# Patient Record
Sex: Male | Born: 1960 | Race: White | Hispanic: No | Marital: Married | Smoking: Former smoker
Health system: Southern US, Community
[De-identification: ages and names within clinical notes are randomized; demographics above are authoritative.]

## PROBLEM LIST (undated history)

## (undated) DIAGNOSIS — Z8249 Family history of ischemic heart disease and other diseases of the circulatory system: Secondary | ICD-10-CM

## (undated) DIAGNOSIS — E785 Hyperlipidemia, unspecified: Secondary | ICD-10-CM

## (undated) HISTORY — PX: DENTAL SURGERY: SHX609

## (undated) HISTORY — PX: COLONOSCOPY W/ POLYPECTOMY: SHX1380

---

## 2018-05-14 ENCOUNTER — Encounter: Payer: Self-pay | Admitting: Emergency Medicine

## 2018-05-14 ENCOUNTER — Emergency Department
Admission: EM | Admit: 2018-05-14 | Discharge: 2018-05-14 | Disposition: A | Discharge status: Home / Self Care | Payer: BC Managed Care – PPO | Attending: Emergency Medicine | Admitting: Emergency Medicine

## 2018-05-14 ENCOUNTER — Other Ambulatory Visit: Payer: Self-pay

## 2018-05-14 ENCOUNTER — Emergency Department: Payer: BC Managed Care – PPO

## 2018-05-14 DIAGNOSIS — D681 Hereditary factor XI deficiency: Secondary | ICD-10-CM

## 2018-05-14 DIAGNOSIS — J36 Peritonsillar abscess: Secondary | ICD-10-CM | POA: Diagnosis not present

## 2018-05-14 DIAGNOSIS — R070 Pain in throat: Secondary | ICD-10-CM | POA: Diagnosis present

## 2018-05-14 DIAGNOSIS — J029 Acute pharyngitis, unspecified: Secondary | ICD-10-CM

## 2018-05-14 HISTORY — DX: Hyperlipidemia, unspecified: E78.5

## 2018-05-14 LAB — CBC WITH DIFFERENTIAL/PLATELET
Abs Immature Granulocytes: 0.05 10*3/uL (ref 0.00–0.07)
BASOS ABS: 0 10*3/uL (ref 0.0–0.1)
Basophils Relative: 0 %
EOS PCT: 0 %
Eosinophils Absolute: 0 10*3/uL (ref 0.0–0.5)
HCT: 39.7 % (ref 39.0–52.0)
Hemoglobin: 14.1 g/dL (ref 13.0–17.0)
Immature Granulocytes: 0 %
Lymphocytes Relative: 18 %
Lymphs Abs: 2.1 10*3/uL (ref 0.7–4.0)
MCH: 31.7 pg (ref 26.0–34.0)
MCHC: 35.5 g/dL (ref 30.0–36.0)
MCV: 89.2 fL (ref 80.0–100.0)
Monocytes Absolute: 1 10*3/uL (ref 0.1–1.0)
Monocytes Relative: 8 %
Neutro Abs: 8.5 10*3/uL — ABNORMAL HIGH (ref 1.7–7.7)
Neutrophils Relative %: 74 %
PLATELETS: 183 10*3/uL (ref 150–400)
RBC: 4.45 MIL/uL (ref 4.22–5.81)
RDW: 11.5 % (ref 11.5–15.5)
WBC: 11.7 10*3/uL — ABNORMAL HIGH (ref 4.0–10.5)
nRBC: 0 % (ref 0.0–0.2)

## 2018-05-14 LAB — BASIC METABOLIC PANEL
Anion gap: 9 (ref 5–15)
BUN: 14 mg/dL (ref 6–20)
CO2: 23 mmol/L (ref 22–32)
Calcium: 9.1 mg/dL (ref 8.9–10.3)
Chloride: 104 mmol/L (ref 98–111)
Creatinine, Ser: 0.96 mg/dL (ref 0.61–1.24)
GFR calc Af Amer: >60 mL/min (ref >60)
GFR calc non Af Amer: >60 mL/min (ref >60)
Glucose, Bld: 97 mg/dL (ref 70–99)
Potassium: 3.9 mmol/L (ref 3.5–5.1)
Sodium: 136 mmol/L (ref 135–145)

## 2018-05-14 LAB — GROUP A STREP BY PCR: Group A Strep by PCR: NOT DETECTED

## 2018-05-14 MED ORDER — FENTANYL CITRATE (PF) 100 MCG/2ML IJ SOLN
50.0000 ug | Freq: Once | INTRAMUSCULAR | Status: AC
  Administered 2018-05-14: 50 ug via INTRAVENOUS
  Filled 2018-05-14: qty 2

## 2018-05-14 MED ORDER — CLINDAMYCIN PHOSPHATE 600 MG/50ML IV SOLN
600.0000 mg | Freq: Once | INTRAVENOUS | Status: AC
  Administered 2018-05-14: 600 mg via INTRAVENOUS
  Filled 2018-05-14: qty 50

## 2018-05-14 MED ORDER — OXYCODONE-ACETAMINOPHEN 5-325 MG/5ML PO SOLN: 5.0000 mL | ORAL | 0 refills | Status: AC | PRN

## 2018-05-14 MED ORDER — DEXAMETHASONE SODIUM PHOSPHATE 10 MG/ML IJ SOLN
10.0000 mg | Freq: Once | INTRAMUSCULAR | Status: AC
  Administered 2018-05-14: 10 mg via INTRAVENOUS
  Filled 2018-05-14: qty 1

## 2018-05-14 MED ORDER — LIDOCAINE-EPINEPHRINE 2 %-1:100000 IJ SOLN
20.0000 mL | Freq: Once | INTRAMUSCULAR | Status: AC
  Administered 2018-05-14: 20 mL
  Filled 2018-05-14: qty 1

## 2018-05-14 MED ORDER — IOHEXOL 300 MG/ML  SOLN
75.0000 mL | Freq: Once | INTRAMUSCULAR | Status: AC | PRN
  Administered 2018-05-14: 75 mL via INTRAVENOUS

## 2018-05-14 MED ORDER — PREDNISONE 20 MG PO TABS: 60.0000 mg | ORAL_TABLET | Freq: Every day | ORAL | 0 refills | Status: DC

## 2018-05-14 MED ORDER — CLINDAMYCIN HCL 300 MG PO CAPS: 300.0000 mg | ORAL_CAPSULE | Freq: Three times a day (TID) | ORAL | 0 refills | Status: AC

## 2018-05-14 NOTE — ED Notes (Signed)
Patient verbalized understanding of discharge instructions, no questions. Patient ambulated out of ED with steady gait in no distress.  

## 2018-05-14 NOTE — Discharge Instructions (Addendum)
You had a small abscess in your tonsils, it was treated by ENT.  If you have increased pain or swelling trouble breathing high fevers inability to swallow or other new or worrisome symptoms return to the emergency department.  Otherwise, please follow closely with primary care doctor and ENT.  Take all the medications as directed until gone unless you have a side effect.

## 2018-05-14 NOTE — ED Provider Notes (Addendum)
[facility] Emergency Department Provider Note  ____________________________________________   I have reviewed the triage vital signs and the nursing notes. Where available I have reviewed prior notes and, if possible and indicated, outside hospital notes.    HISTORY  Chief Complaint Sore Throat    HPI Norman Ritter is a 58 y.o. male who is healthy, states that over the last 3 to 4 days he has had some right ear pain, which developed into a sore throat.  His throat soreness is getting worse.  He does feel some swelling in the upper part of his soft palate.  He does not have any trouble breathing but it hurts to swallow and he is not able to eat much because of the pain.  He denies any fever or chills or vomiting, nothing makes it better nothing makes it worse no other alleviating or aggravating symptoms, did take Tylenol at some point.  Would like pain medication.  Denies any recent antibiotics.  No other medical history.    Past Medical History:  Diagnosis Date  . Hyperlipemia     There are no active problems to display for this patient.     Prior to Admission medications   Not on File    Allergies Patient has no known allergies.  No family history on file.  Social History Social History   Tobacco Use  . Smoking status: Never Smoker  . Smokeless tobacco: Never Used  Substance Use Topics  . Alcohol use: Yes  . Drug use: Never    Review of Systems Constitutional: No fever/chills Eyes: No visual changes. ENT: +sore throat. No stiff neck no neck pain Cardiovascular: Denies chest pain. Respiratory: Denies shortness of breath. Gastrointestinal:   no vomiting.  No diarrhea.  No constipation. Genitourinary: Negative for dysuria. Musculoskeletal: Negative lower extremity swelling Skin: Negative for rash. Neurological: Negative for severe headaches, focal weakness or numbness.   ____________________________________________   PHYSICAL  EXAM:  VITAL SIGNS: ED Triage Vitals  Enc Vitals Group     BP 05/14/18 1806 (!) 134/91     Pulse Rate 05/14/18 1806 93     Resp --      Temp 05/14/18 1806 99.1 F (37.3 C)     Temp Source 05/14/18 1806 Oral     SpO2 05/14/18 1806 96 %     Weight 05/14/18 1807 220 lb (99.8 kg)     Height 05/14/18 1807 6' (1.829 m)     Head Circumference --      Peak Flow --      Pain Score 05/14/18 1807 7     Pain Loc --      Pain Edu? --      Excl. in GC? --     Constitutional: Alert and oriented. Well appearing and in no acute distress. Eyes: Conjunctivae are normal Head: Atraumatic HEENT: No congestion/rhinnorhea. Mucous membranes are moist.  He can open his mouth fully although he states it hurts there is no clear trismus, he has a posterior pharynx which appears grossly normal slightly erythematous but is soft palate is quite swollen and edematous on the right side.  There is no drainage.  There is no obvious fluctuance.  There is no stridor, he speaks in clear voice with no hoarseness.  There is no evidence of respiratory distress Neck:   Nontender with no meningismus, no masses, no stridor Cardiovascular: Normal rate, regular rhythm. Grossly normal heart sounds.  Good peripheral circulation. Respiratory: Normal respiratory effort.  No retractions.  Lungs CTAB. Abdominal: Soft and nontender. No distention. No guarding no rebound Back:  There is no focal tenderness or step off.  there is no midline tenderness there are no lesions noted. there is no CVA tenderness Musculoskeletal: No lower extremity tenderness, no upper extremity tenderness. No joint effusions, no DVT signs strong distal pulses no edema Neurologic:  Normal speech and language. No gross focal neurologic deficits are appreciated.  Skin:  Skin is warm, dry and intact. No rash noted. Psychiatric: Mood and affect are normal. Speech and behavior are normal.  ____________________________________________   LABS (all labs ordered are  listed, but only abnormal results are displayed)  Labs Reviewed  CBC WITH DIFFERENTIAL/PLATELET - Abnormal; Notable for the following components:      Result Value   WBC 11.7 (*)    Neutro Abs 8.5 (*)    All other components within normal limits  CULTURE, BETA STREP (GROUP B ONLY)  BASIC METABOLIC PANEL    Pertinent labs  results that were available during my care of the patient were reviewed by me and considered in my medical decision making (see chart for details). ____________________________________________  EKG  I personally interpreted any EKGs ordered by me or triage  ____________________________________________  RADIOLOGY  Pertinent labs & imaging results that were available during my care of the patient were reviewed by me and considered in my medical decision making (see chart for details). If possible, patient and/or family made aware of any abnormal findings.  No results found. ____________________________________________    PROCEDURES  Procedure(s) performed: None  Procedures  Critical Care performed: None  ____________________________________________   INITIAL IMPRESSION / ASSESSMENT AND PLAN / ED COURSE  Pertinent labs & imaging results that were available during my care of the patient were reviewed by me and considered in my medical decision making (see chart for details).  Patient here with soft tissue swelling most likely infectious in origin, consistent with a phlegmon versus early abscess, no evidence of impending airway issue.  Very difficult from just oral exam to get a full assessment of the degree of loculation, we will therefore obtain imaging.  In the meantime I am giving him steroids, to help with the inflammation, we will monitor him closely the emergency room to forestall any possible airway decompensation and we will administer pain medications and reassess.  ----------------------------------------- 7:54 PM on  05/14/2018 -----------------------------------------  Patient with a tiny abscess, discussed with ENT, Dr. Willeen CassBennett, he is graciously looking at the CT scan and will call me back  ----------------------------------------- 8:40 PM on 05/14/2018 -----------------------------------------  Seen and evaluated by Dr. Willeen CassBennett, please see his note.  He did have an I&D of that abscess which went very well at bedside, and he feels better already.  ENT is requesting that we discharge the patient with clindamycin and steroids which we will effectuate.  Return precautions follow-up given and understood.  Patient and I discussed admission but he is very adamant that he would prefer to go home.    ____________________________________________   FINAL CLINICAL IMPRESSION(S) / ED DIAGNOSES  Final diagnoses:  None      This chart was dictated using voice recognition software.  Despite best efforts to proofread,  errors can occur which can change meaning.      Jeanmarie PlantMcShane, Febe Champa A, MD 05/14/18 1837    Jeanmarie PlantMcShane, Serra Younan A, MD 05/14/18 1954    Jeanmarie PlantMcShane, Julieann Drummonds A, MD 05/14/18 2040

## 2018-05-14 NOTE — ED Triage Notes (Signed)
Pt with right ear pain that started Sunday, went to md on Monday and had wax removed, returned to [facility] today for increased pain and was told he has a peritonsillar abscess and to come to ed. Pt states that he can feel his throat swelling. No acute respiratory distress.

## 2018-05-14 NOTE — Consult Note (Signed)
Despina AriasCarolan, Tymere [id number] 1961-02-16 Sandi MealyPaul S Allysha Tryon, MD  Reason for Consult: Right peritonsillar abscess Requesting Physician: Jeanmarie PlantMcShane, James A, MD Consulting Physician: Sandi MealyPaul S Armanii Urbanik, MD  HPI: This 58 y.o. year old male presented to the emergency room on 05/14/2018 for worsening sore throat.  He had some ear pain earlier in the week and had his ear cleared of cerumen at an urgent care.  Over the past 24 hours he is developed further ear pain, some feeling of congestion of the back of his nose, and worsening sore throat.  He is also noted it is more difficult to swallow, but has not had any significant fever.  CT in the emergency room confirmed a small 1.3 cm right peritonsillar abscess.  He has received clindamycin and Decadron in the emergency room and seems a little better even after those medications.  There is no history of recurrent tonsillitis.  Allergies: No Known Allergies  Medications: (Not in a hospital admission) .  No current facility-administered medications for this encounter.    No current outpatient medications on file.    PMH:  Past Medical History:  Diagnosis Date  . Hyperlipemia     Fam Hx: No family history on file.  Soc Hx:  Social History   Socioeconomic History  . Marital status: Married    Spouse name: Not on file  . Number of children: Not on file  . Years of education: Not on file  . Highest education level: Not on file  Occupational History  . Not on file  Social Needs  . Financial resource strain: Not on file  . Food insecurity:    Worry: Not on file    Inability: Not on file  . Transportation needs:    Medical: Not on file    Non-medical: Not on file  Tobacco Use  . Smoking status: Never Smoker  . Smokeless tobacco: Never Used  Substance and Sexual Activity  . Alcohol use: Yes  . Drug use: Never  . Sexual activity: Not on file  Lifestyle  . Physical activity:    Days per week: Not on file    Minutes per session: Not on file  .  Stress: Not on file  Relationships  . Social connections:    Talks on phone: Not on file    Gets together: Not on file    Attends religious service: Not on file    Active member of club or organization: Not on file    Attends meetings of clubs or organizations: Not on file    Relationship status: Not on file  . Intimate partner violence:    Fear of current or ex partner: Not on file    Emotionally abused: Not on file    Physically abused: Not on file    Forced sexual activity: Not on file  Other Topics Concern  . Not on file  Social History Narrative  . Not on file     PSH: Noncontributory   ROS: Review of systems normal other than 12 systems except per HPI.  PHYSICAL EXAM Vitals:  Vitals:   05/14/18 1806 05/14/18 1930  BP: (!) 134/91 (!) 148/85  Pulse: 93 82  Resp:  16  Temp: 99.1 F (37.3 C)   SpO2: 96% 98%  .  General: Well-developed, Well-nourished in no acute distress Mood: Mood and affect well adjusted, pleasant and cooperative. Orientation: Grossly alert and oriented. Vocal Quality: No hoarseness. Communicates verbally. head and Face: NCAT. No facial asymmetry. No visible skin lesions.  No significant facial scars. No tenderness with sinus percussion. Facial strength normal and symmetric. Ears: External ears with normal landmarks, no lesions. External auditory canals free of infection, cerumen impaction or lesions. Tympanic membranes intact with good landmarks and normal mobility on pneumatic otoscopy. No middle ear effusion. Hearing: Speech reception grossly normal. Nose: External nose normal with midline dorsum and no lesions or deformity. Nasal Cavity reveals essentially midline septum with normal inferior turbinates. No significant mucosal congestion or erythema. Nasal secretions are minimal and clear. No polyps seen on anterior rhinoscopy. Oral Cavity/ Oropharynx: Lips are normal with no lesions. Teeth no frank dental caries. Gingiva healthy with no lesions or  gingivitis.  The right tonsil is edematous without significant erythema and is pushed medially with some shift of the uvula to the left.  The uvula is a little swollen as well.  There is mild to moderate swelling of the adjacent soft palate on the right. Indirect Laryngoscopy/Nasopharyngoscopy: Visualization of the larynx, hypopharynx and nasopharynx is not possible in this setting with routine examination. Neck: Supple and symmetric with no palpable masses, tenderness or crepitance. The trachea is midline. Thyroid gland is soft, nontender and symmetric with no masses or enlargement. Parotid and submandibular glands are soft, nontender and symmetric, without masses. Lymphatic: Shotty right cervical lymphadenopathy. Respiratory: Normal respiratory effort without labored breathing. Cardiovascular: Carotid pulse shows regular rate and rhythm Neurologic: Cranial Nerves II through XII are grossly intact. Eyes: Gaze and Ocular Motility are grossly normal. PERRLA. No visible nystagmus.  MEDICAL DECISION MAKING: Data Review:  Results for orders placed or performed during the hospital encounter of 05/14/18 (from the past 48 hour(s))  CBC with Differential     Status: Abnormal   Collection Time: 05/14/18  6:16 PM  Result Value Ref Range   WBC 11.7 (H) 4.0 - 10.5 K/uL   RBC 4.45 4.22 - 5.81 MIL/uL   Hemoglobin 14.1 13.0 - 17.0 g/dL   HCT 66.0 63.0 - 16.0 %   MCV 89.2 80.0 - 100.0 fL   MCH 31.7 26.0 - 34.0 pg   MCHC 35.5 30.0 - 36.0 g/dL   RDW 10.9 32.3 - 55.7 %   Platelets 183 150 - 400 K/uL   nRBC 0.0 0.0 - 0.2 %   Neutrophils Relative % 74 %   Neutro Abs 8.5 (H) 1.7 - 7.7 K/uL   Lymphocytes Relative 18 %   Lymphs Abs 2.1 0.7 - 4.0 K/uL   Monocytes Relative 8 %   Monocytes Absolute 1.0 0.1 - 1.0 K/uL   Eosinophils Relative 0 %   Eosinophils Absolute 0.0 0.0 - 0.5 K/uL   Basophils Relative 0 %   Basophils Absolute 0.0 0.0 - 0.1 K/uL   Immature Granulocytes 0 %   Abs Immature Granulocytes  0.05 0.00 - 0.07 K/uL    Comment: Performed at [facility], [address] [place], [place] [address]  Basic metabolic panel     Status: None   Collection Time: 05/14/18  6:16 PM  Result Value Ref Range   Sodium 136 135 - 145 mmol/L   Potassium 3.9 3.5 - 5.1 mmol/L   Chloride 104 98 - 111 mmol/L   CO2 23 22 - 32 mmol/L   Glucose, Bld 97 70 - 99 mg/dL   BUN 14 6 - 20 mg/dL   Creatinine, Ser 5.42 0.61 - 1.24 mg/dL   Calcium 9.1 8.9 - 70.6 mg/dL   GFR calc non Af Amer >60 >60 mL/min   GFR calc Af  Amer >60 >60 mL/min   Anion gap 9 5 - 15    Comment: Performed at [facility], [address] [place], [place] [address]  . Ct Soft Tissue Neck W Contrast  Result Date: 05/14/2018 CLINICAL DATA:  58 y/o M; throat swelling, rule out peritonsillar abscess. EXAM: CT NECK WITH CONTRAST TECHNIQUE: Multidetector CT imaging of the neck was performed using the standard protocol following the bolus administration of intravenous contrast. CONTRAST:  33mL OMNIPAQUE IOHEXOL 300 MG/ML  SOLN COMPARISON:  None. FINDINGS: Pharynx and larynx: Right-sided palatine tonsil abscess measuring approximately 13 mm, partially obscured by streak artifact from dental hardware (series 7, image 44). Surrounding inflammation is present within the right parapharyngeal space and right lateral wall of the pharynx. No extension of the abscess into deep cervical compartments. Salivary glands: No inflammation, mass, or stone. Thyroid: Normal. Lymph nodes: Mildly enlarged right-sided cervical lymph nodes, likely reactive. No lymph node necrosis. Vascular: Negative. Limited intracranial: Negative. Visualized orbits: Negative. Mastoids and visualized paranasal sinuses: Clear. Skeleton: No acute or aggressive process. Mild cervical spondylosis with disc and facet degenerative changes greatest at C5-C7 levels. Upper chest: Negative. Other: None. IMPRESSION: Right palatine tonsil abscess measuring approximately 13 mm,  partially obscured by streak artifact from dental hardware. Surrounding inflammation within the right parapharyngeal space and right lateral wall of the pharynx. No extension of the abscess into deep cervical compartments. Electronically Signed   By: Mitzi Hansen M.D.   On: 05/14/2018 19:12  .   PROCEDURE: Preoperative Diagnosis: Right peritonsillar abscess Postoperative Diagnosis: Same Procedure: Incision and drainage of right peritonsillar abscess Indications: Right peritonsillar abscess Findings: Small right peritonsillar abscess located just lateral to the tonsil.  Approximately 1.5 cc of cloudy purulent fluid was aspirated. Description of Procedure: After discussing procedure and risks  (primarily bleeding) with the patient, the throat anesthetized with topical Hurricane spray and the region around the right tonsil injected with 2% lidocaine with epinephrine, 1:100,000. An 18 gage needle was used to aspirate above the tonsil, aspirating approximately 1.5 cc of purulence. This area was then opened with a 15 blade and a hemostat used to carefully bluntly open the peritonsillar space and drain any loculated purulence. The patient tolerated the procedure well.   ASSESSMENT: Right peritonsillar abscess  PLAN: The abscess was successfully drained.  The patient appears to be handling his secretions well and noted immediate relief after drainage of the abscess, and was already improving after initial doses of clindamycin and Decadron.  Would recommend discharge home on clindamycin 300 mg p.o. 4 times daily for 10 days and a Sterapred double strength 6-day taper.  He can follow-up in my office next week.  If he has any worsening over the weekend he should return to the emergency room.  Sandi Mealy, MD 05/14/2018 8:37 PM

## 2019-04-29 ENCOUNTER — Other Ambulatory Visit: Payer: Self-pay | Admitting: Family Medicine

## 2019-04-29 ENCOUNTER — Other Ambulatory Visit (HOSPITAL_COMMUNITY): Payer: Self-pay | Admitting: Family Medicine

## 2019-04-29 DIAGNOSIS — N5089 Other specified disorders of the male genital organs: Secondary | ICD-10-CM

## 2019-05-06 ENCOUNTER — Ambulatory Visit
Admission: RE | Admit: 2019-05-06 | Discharge: 2019-05-06 | Disposition: A | Discharge status: Home / Self Care | Payer: BC Managed Care – PPO | Source: Ambulatory Visit | Attending: Family Medicine | Admitting: Family Medicine

## 2019-05-06 ENCOUNTER — Other Ambulatory Visit: Payer: Self-pay

## 2019-05-06 DIAGNOSIS — N5089 Other specified disorders of the male genital organs: Secondary | ICD-10-CM | POA: Diagnosis not present

## 2019-05-07 ENCOUNTER — Other Ambulatory Visit: Payer: Self-pay | Admitting: Family Medicine

## 2019-05-07 DIAGNOSIS — N5089 Other specified disorders of the male genital organs: Secondary | ICD-10-CM

## 2019-06-11 ENCOUNTER — Ambulatory Visit: Payer: BC Managed Care – PPO | Admitting: Urology

## 2019-06-11 ENCOUNTER — Encounter: Payer: Self-pay | Admitting: Urology

## 2019-06-11 ENCOUNTER — Other Ambulatory Visit: Payer: Self-pay

## 2019-06-11 VITALS — BP 138/95 | HR 82 | Ht 72.0 in | Wt 222.0 lb

## 2019-06-11 DIAGNOSIS — N5089 Other specified disorders of the male genital organs: Secondary | ICD-10-CM | POA: Diagnosis not present

## 2019-06-12 ENCOUNTER — Encounter: Payer: Self-pay | Admitting: Urology

## 2019-06-12 NOTE — Progress Notes (Signed)
06/11/2019 7:59 AM   Despina Arias 04-Sep-1960 [id number]  Referring provider: Jerl Mina, MD [address] [facility] [place],  [place] [address]  Chief Complaint  Patient presents with  . Other    HPI: 59 y.o. male seen in consultation at the request of Dr. Burnett Sheng for evaluation of a left hemiscrotal mass.  He noticed a mass in the left upper scrotal/groin region on 04/25/2019.  He saw Dr. Burnett Sheng on 12/23 and the exam describes a 3 x 5 cm hard mass in the left hemiscrotum.  A scrotal ultrasound was ordered which showed normal-appearing testes without mass, hydrocele or spermatocele. A 0.9 x 0.5 x 0.6 cm ill-defined subcutaneous lesion was identified lateral to the left hemiscrotum at the site of the palpable abnormality.  He had noted a small amount of drainage from the area and when he checked last week he states the lesion was gone.  He had no pain, redness or significant drainage.  PMH: Past Medical History:  Diagnosis Date  . Hyperlipemia     Surgical History: History reviewed. No pertinent surgical history.  Home Medications:  Allergies as of 06/11/2019   No Known Allergies     Medication List       Accurate as of June 11, 2019 11:59 PM. If you have any questions, ask your nurse or doctor.        metroNIDAZOLE 1 % gel Commonly known as: METROGEL Apply 1 application topically daily.   predniSONE 20 MG tablet Commonly known as: Deltasone Take 3 tablets (60 mg total) by mouth daily.   simvastatin 40 MG tablet Commonly known as: ZOCOR Take 40 mg by mouth at bedtime.       Allergies: No Known Allergies  Family History: History reviewed. No pertinent family history.  Social History:  reports that he has never smoked. He has never used smokeless tobacco. He reports current alcohol use. He reports that he does not use drugs.  ROS: UROLOGY Frequent Urination?: No Hard to postpone urination?: No Burning/pain with urination?: No Get up  at night to urinate?: No Leakage of urine?: No Urine stream starts and stops?: No Trouble starting stream?: No Do you have to strain to urinate?: No Blood in urine?: No Urinary tract infection?: No Sexually transmitted disease?: No Injury to kidneys or bladder?: No Painful intercourse?: No Weak stream?: No Erection problems?: No Penile pain?: No  Gastrointestinal Nausea?: No Vomiting?: No Indigestion/heartburn?: No Diarrhea?: No Constipation?: No  Constitutional Fever: No Night sweats?: No Weight loss?: No Fatigue?: No  Skin Skin rash/lesions?: Yes Itching?: No  Eyes Blurred vision?: No Double vision?: No  Ears/Nose/Throat Sore throat?: No Sinus problems?: No  Hematologic/Lymphatic Swollen glands?: Yes Easy bruising?: No  Cardiovascular Leg swelling?: No Chest pain?: No  Respiratory Cough?: No Shortness of breath?: No  Endocrine Excessive thirst?: No  Musculoskeletal Back pain?: No Joint pain?: No  Neurological Headaches?: No Dizziness?: No  Psychologic Depression?: No Anxiety?: No  Physical Exam: BP (!) 138/95   Pulse 82   Ht 6' (1.829 m)   Wt 222 lb (100.7 kg)   BMI 30.11 kg/m   Constitutional:  Alert and oriented, No acute distress. HEENT: [place] AT, moist mucus membranes.  Trachea midline, no masses. Cardiovascular: No clubbing, cyanosis, or edema. Respiratory: Normal respiratory effort, no increased work of breathing. GU: Phallus without lesions, testes descended bilaterally without masses or tenderness.  Spermatic cord/epididymis palpably normal bilaterally.  In the area where the patient indicates the mass was  previously noted there is mild skin discoloration.  There is minimal induration in the area but no discrete mass Lymph: No inguinal lymphadenopathy. Skin: No rashes, bruises or suspicious lesions. Neurologic: Grossly intact, no focal deficits, moving all 4 extremities. Psychiatric: Normal mood and affect.   Pertinent  Imaging: Scrotal ultrasound personally reviewed   Assessment & Plan:   59 y.o. male with a history of left groin mass which on ultrasound was superficial.  The mass is no longer present and most likely an inclusion cyst which spontaneously ruptured.  He will follow-up as needed for any recurrent lesions for reexam.   Abbie Sons, MD  [place] [address], [place], [place] [address] [phone]

## 2019-08-24 ENCOUNTER — Ambulatory Visit: Payer: BC Managed Care – PPO | Admitting: Dermatology

## 2019-08-24 ENCOUNTER — Other Ambulatory Visit: Payer: Self-pay

## 2019-08-24 DIAGNOSIS — L570 Actinic keratosis: Secondary | ICD-10-CM | POA: Diagnosis not present

## 2019-08-24 DIAGNOSIS — L578 Other skin changes due to chronic exposure to nonionizing radiation: Secondary | ICD-10-CM | POA: Diagnosis not present

## 2019-08-24 NOTE — Progress Notes (Signed)
   Follow-Up Visit   Subjective  Norman Ritter is a 59 y.o. male who presents for the following: Actinic Keratosis (face and hands - recheck previously treated skin lesions and sun exposed areas).  The following portions of the chart were reviewed this encounter and updated as appropriate: Tobacco  Allergies  Meds  Problems  Med Hx  Surg Hx  Fam Hx     Review of Systems: No other skin or systemic complaints.  Objective  Well appearing patient in no apparent distress; mood and affect are within normal limits.  A focused examination was performed including the face, ears, arms and hands. Relevant physical exam findings are noted in the Assessment and Plan.  Objective  Face and ears x 7 (7): Erythematous thin papules/macules with gritty scale.   Assessment & Plan    AK (actinic keratosis) (7) Face and ears x 7  Destruction of lesion - Face and ears x 7 Complexity: simple   Destruction method: cryotherapy   Informed consent: discussed and consent obtained   Timeout:  patient name, date of birth, surgical site, and procedure verified Lesion destroyed using liquid nitrogen: Yes   Region frozen until ice ball extended beyond lesion: Yes   Outcome: patient tolerated procedure well with no complications   Post-procedure details: wound care instructions given     Actinic Damage - diffuse scaly erythematous macules with underlying dyspigmentation - Recommend daily broad spectrum sunscreen SPF 30+ to sun-exposed areas, reapply every 2 hours as needed.  - Call for new or changing lesions.  Return in about 6 months (around 02/23/2020) for TBSE.   Maylene Roes, CMA, am acting as scribe for Armida Sans, MD .

## 2019-08-25 ENCOUNTER — Encounter: Payer: Self-pay | Admitting: Dermatology

## 2020-02-22 ENCOUNTER — Ambulatory Visit: Payer: BC Managed Care – PPO | Admitting: Dermatology

## 2020-04-28 IMAGING — CT CT NECK W/ CM
5 of 6 series · 14 of 35 positions shown, 16 images · IV contrast (omnipaque)
Comparison: None.

CLINICAL DATA: 57 y/o M; throat swelling, rule out peritonsillar
abscess.

EXAM:
CT NECK WITH CONTRAST
TECHNIQUE: Multidetector CT imaging of the neck was performed using the
standard protocol following the bolus administration of intravenous
contrast.
CONTRAST:  75mL OMNIPAQUE IOHEXOL 300 MG/ML  SOLN

[Series 2: axial neck · axial · 0.62mm/px · z∈[-321,-227]mm · 2 of 141 slices shown, 3 images]
[im 47/141  soft-tissue]
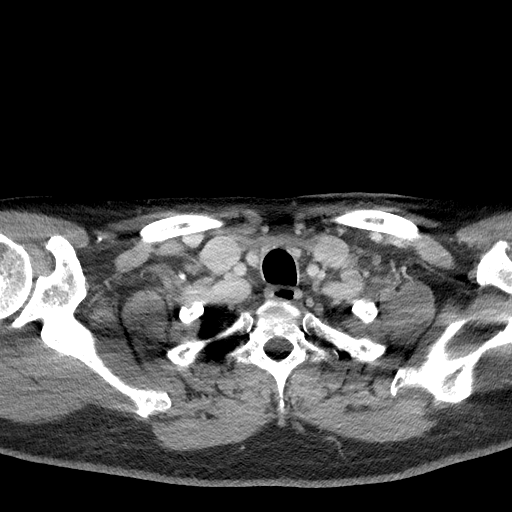
[im 47/141  bone]
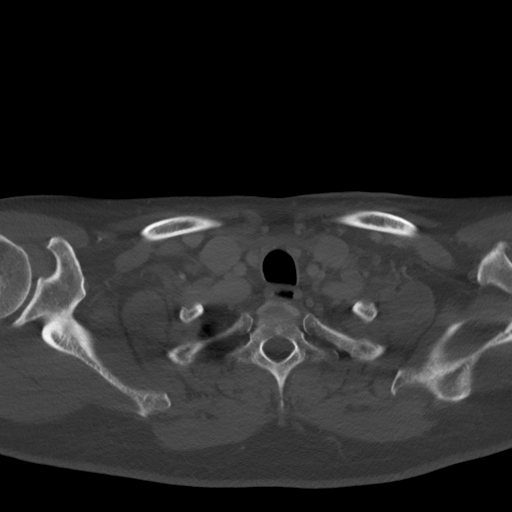
[im 94/141  bone]
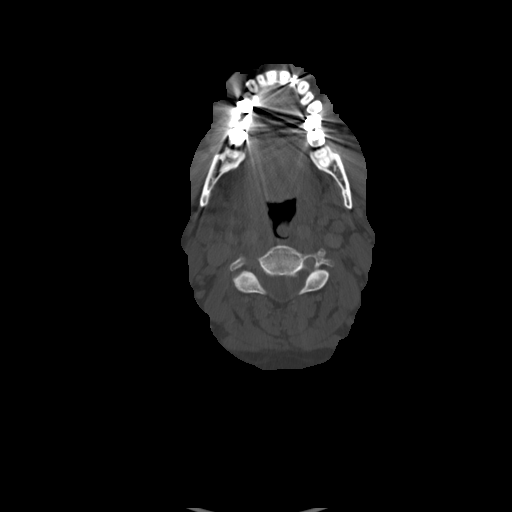

[Series 5: axial neck (person_name) (person_name) · axial · 0.62mm/px · z∈[-321,-227]mm · 2 of 141 slices shown]
[im 47/141  bone]
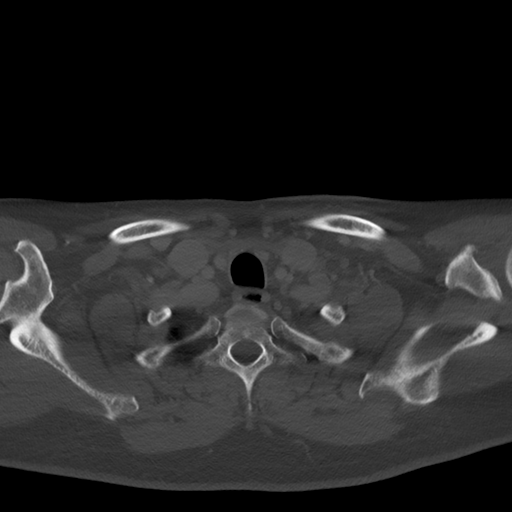
[im 94/141  bone]
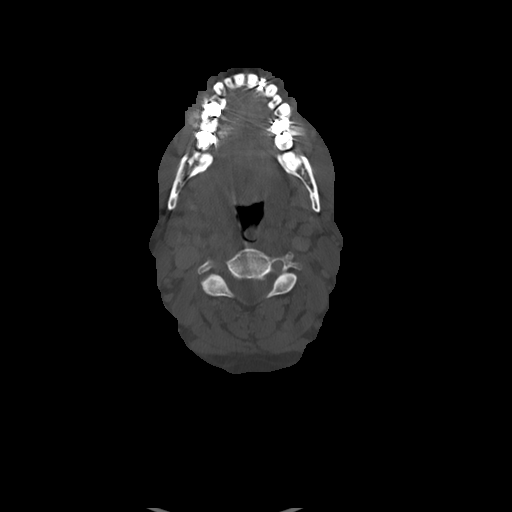

[Series 7: sag neck · sagittal · 0.61mm/px · 5 of 117 slices shown, 6 images]
[im 39/117  bone]
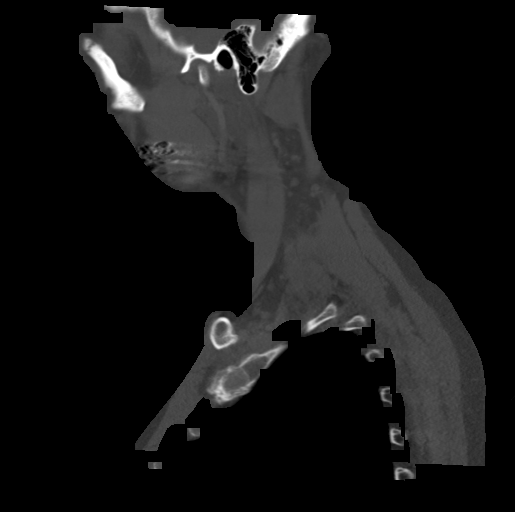
[im 49/117  bone]
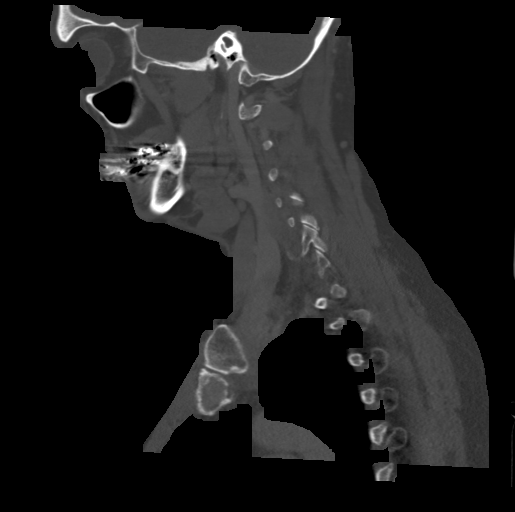
[im 59/117  soft-tissue]
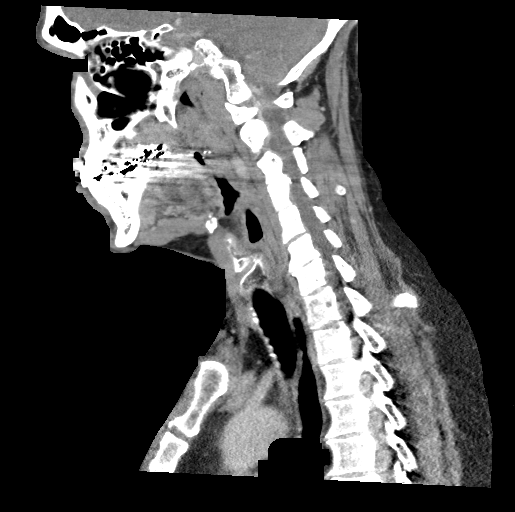
[im 59/117  bone]
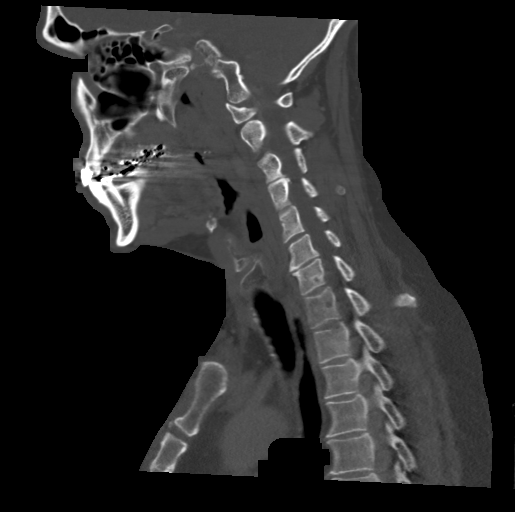
[im 68/117  bone]
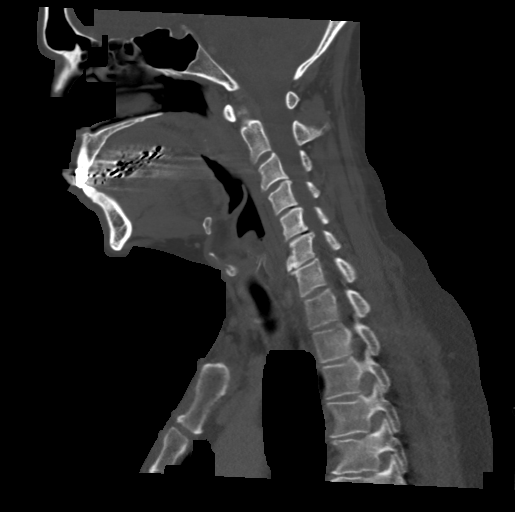
[im 78/117  bone]
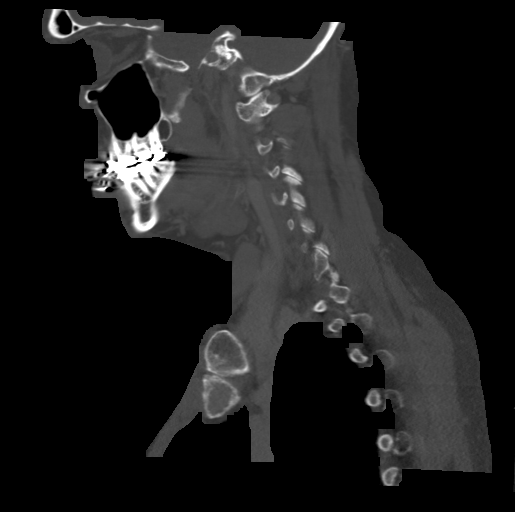

[Series 8: cor neck · coronal · 0.53mm/px · 3 of 146 slices shown]
[im 30/146  bone]
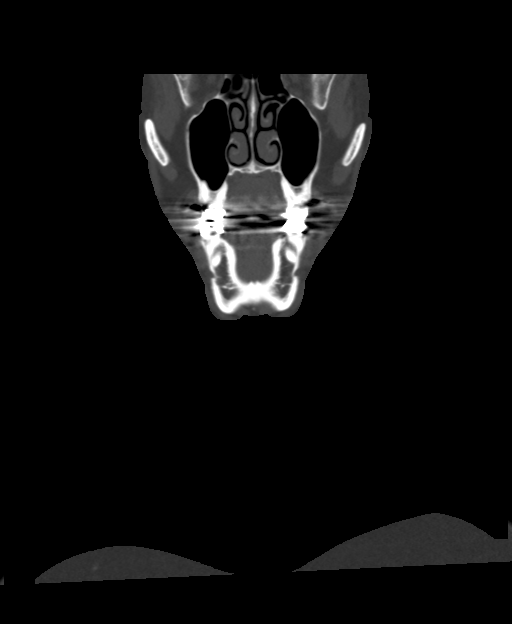
[im 59/146  bone]
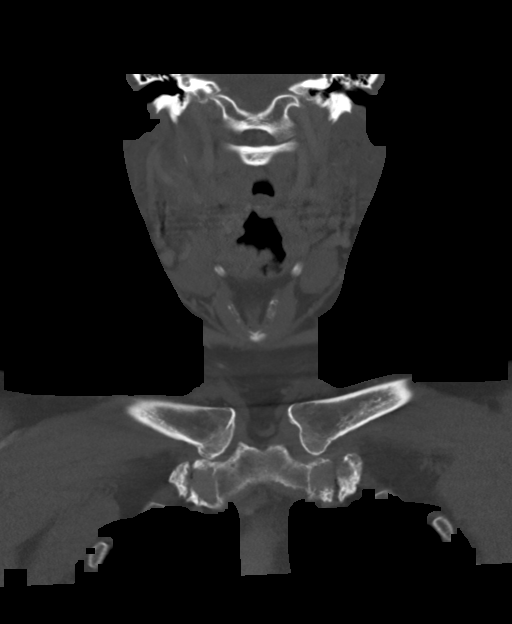
[im 88/146  bone]
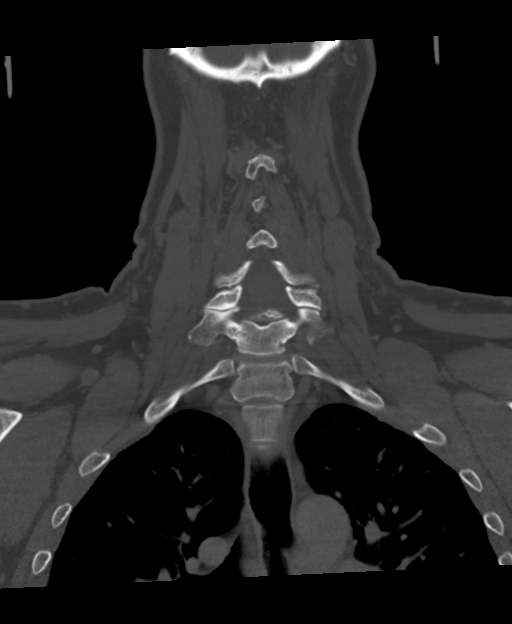

[Series 9: orthogonal ax · axial · 0.61mm/px · z∈[-323,-223]mm · 2 of 150 slices shown]
[im 50/150  bone]
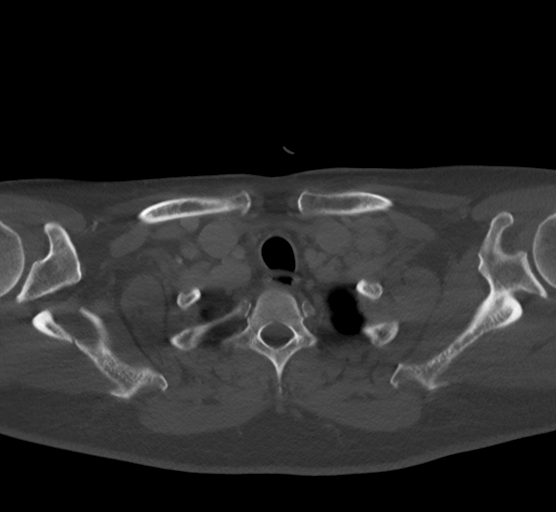
[im 100/150  bone]
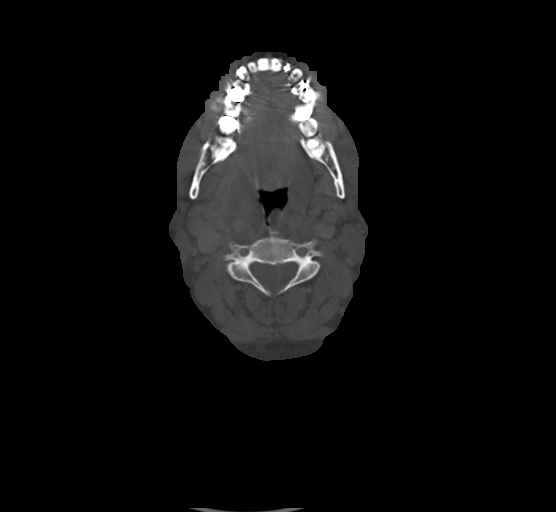

[14 of 35 positions shown; findings below may reference images not displayed]

FINDINGS: Pharynx and larynx: Right-sided palatine tonsil abscess measuring
approximately 13 mm, partially obscured by streak artifact from
dental hardware (series 7, image 44). Surrounding inflammation is
present within the right parapharyngeal space and right lateral wall
of the pharynx. No extension of the abscess into deep cervical
compartments.

Salivary glands: No inflammation, mass, or stone.

Thyroid: Normal.

Lymph nodes: Mildly enlarged right-sided cervical lymph nodes,
likely reactive. No lymph node necrosis.

Vascular: Negative.

Limited intracranial: Negative.

Visualized orbits: Negative.

Mastoids and visualized paranasal sinuses: Clear.

Skeleton: No acute or aggressive process. Mild cervical spondylosis
with disc and facet degenerative changes greatest at C5-C7 levels.

Upper chest: Negative.

Other: None.
IMPRESSION: Right palatine tonsil abscess measuring approximately 13 mm,
partially obscured by streak artifact from dental hardware.
Surrounding inflammation within the right parapharyngeal space and
right lateral wall of the pharynx. No extension of the abscess into
deep cervical compartments.

## 2021-02-04 HISTORY — PX: KNEE ARTHROSCOPY W/ DEBRIDEMENT: SHX1867

## 2021-04-20 IMAGING — US US SCROTUM W/ DOPPLER COMPLETE
1 series · 13 of 25 positions shown · non-contrast
Comparison: None.

CLINICAL DATA: Scrotal mass

EXAM:
SCROTAL ULTRASOUND
DOPPLER ULTRASOUND OF THE TESTICLES
TECHNIQUE: Complete ultrasound examination of the testicles, epididymis, and
other scrotal structures was performed. Color and spectral Doppler
ultrasound were also utilized to evaluate blood flow to the
testicles.

[Series 1: us scrotum w/ doppler complete · 0.08mm/px · 59 acquisitions, 13 frames shown]
[im 1/59]
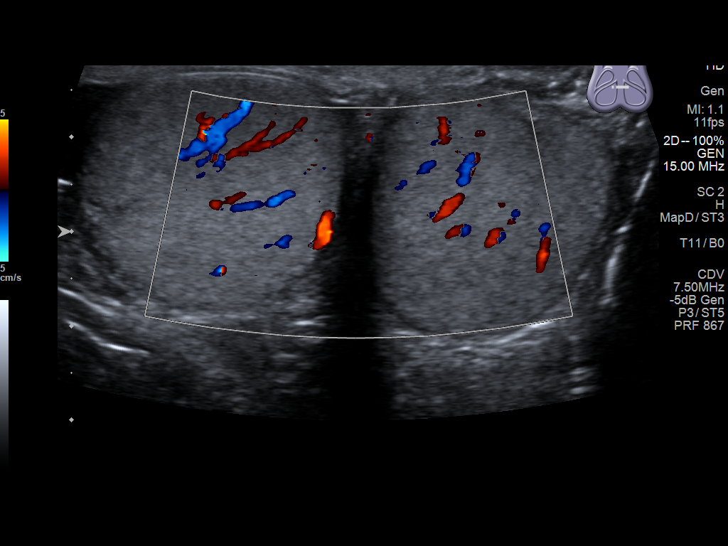
[im 5/59]
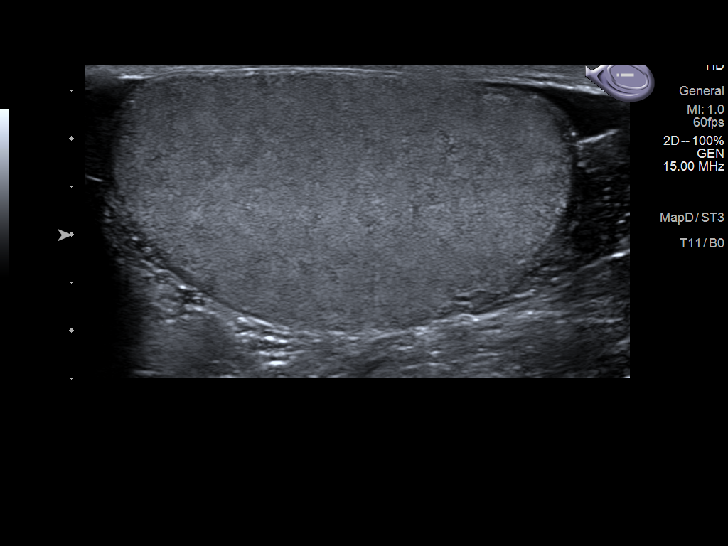
[im 10/59]
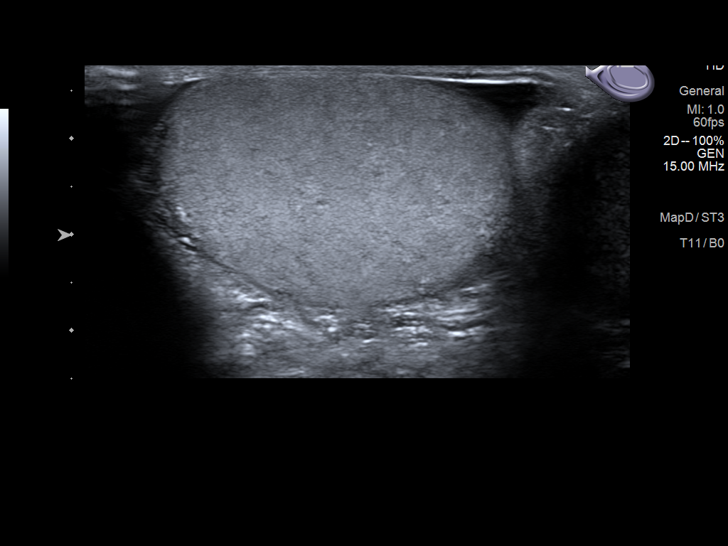
[im 15/59]
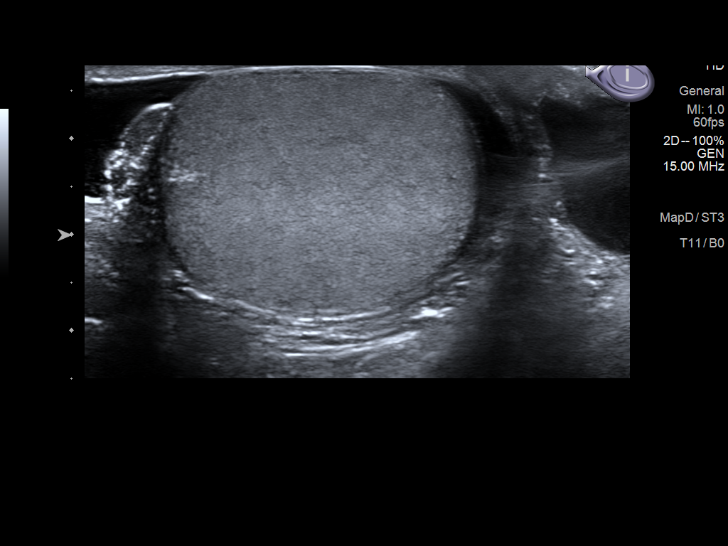
[im 20/59]
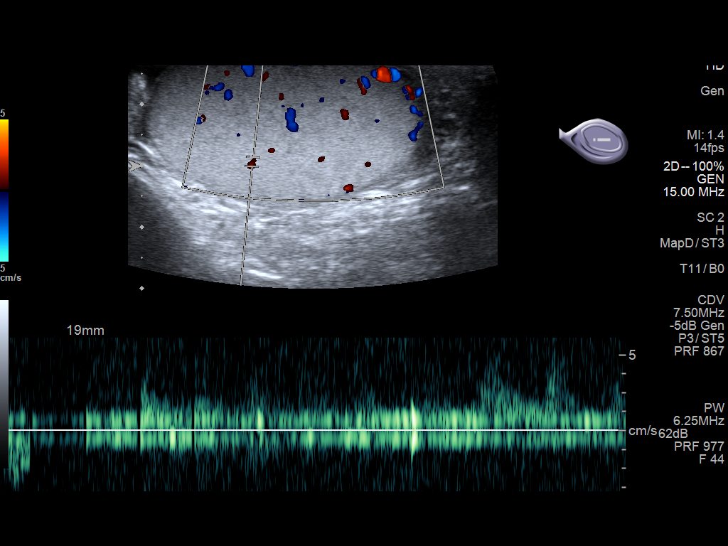
[im 25/59]
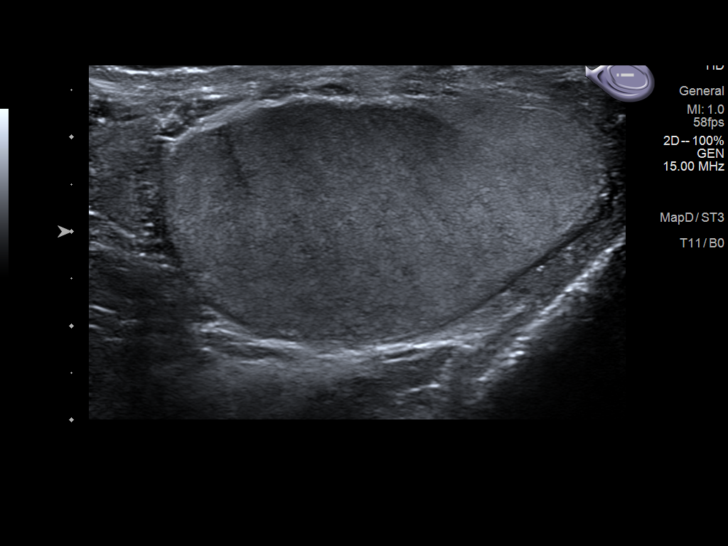
[im 30/59]
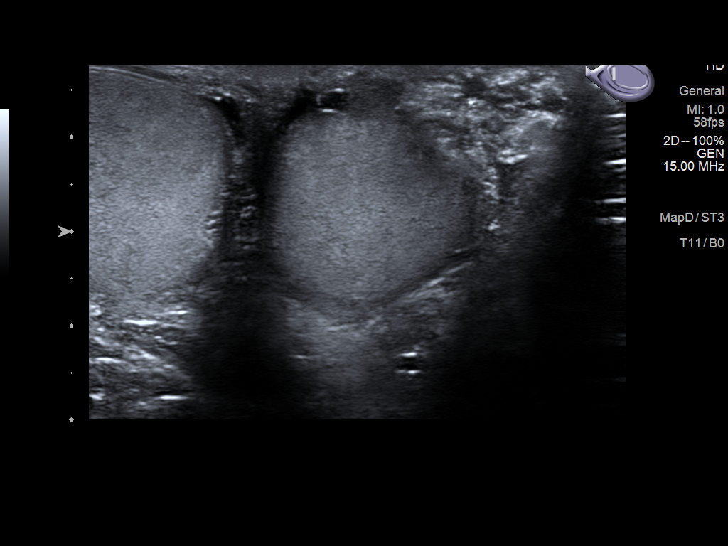
[im 34/59]
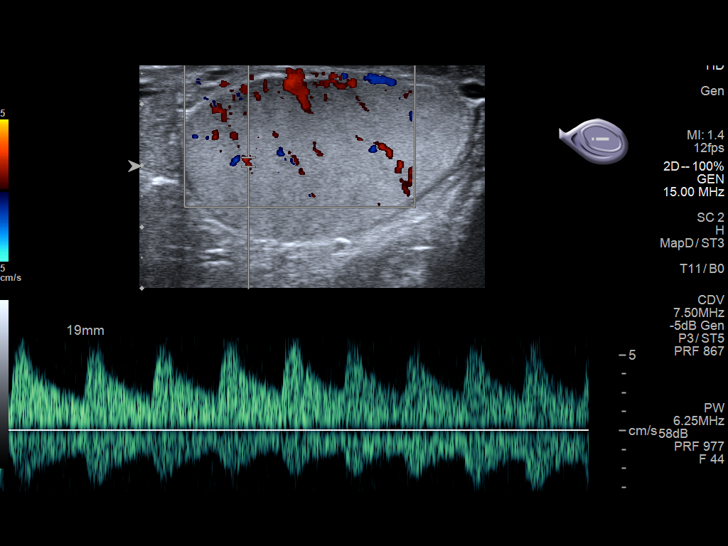
[im 39/59]
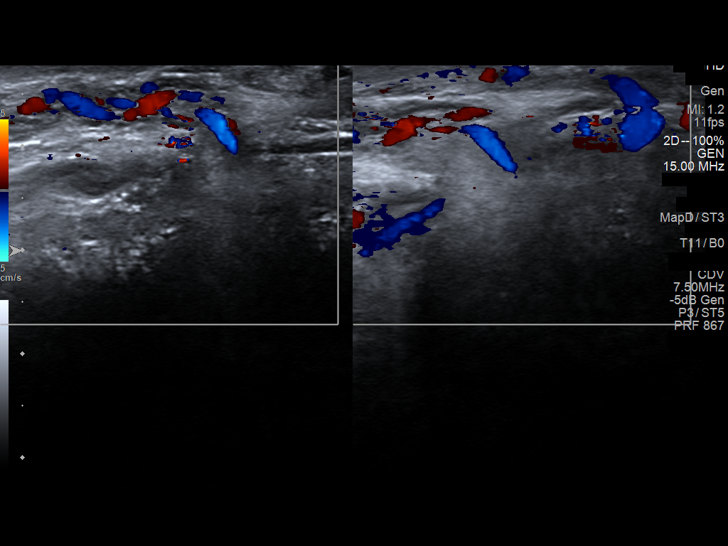
[im 44/59]
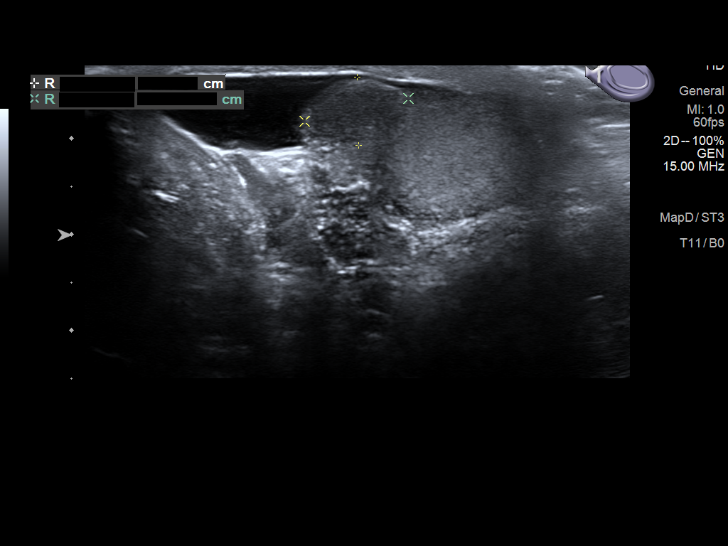
[im 49/59]
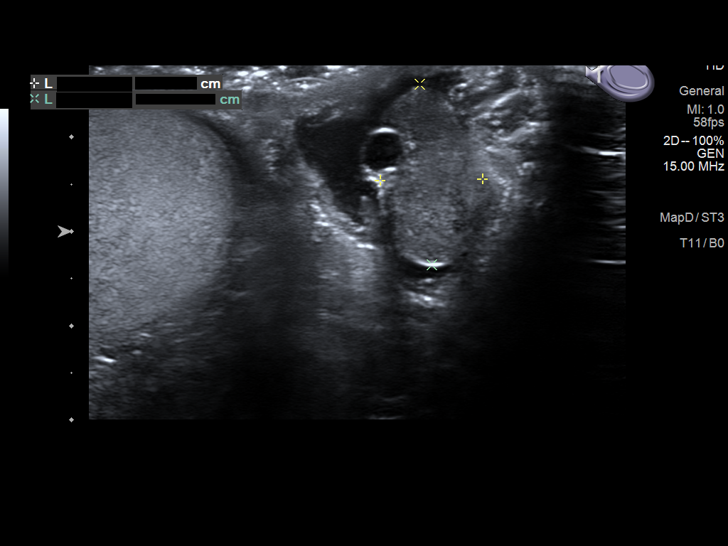
[im 54/59]
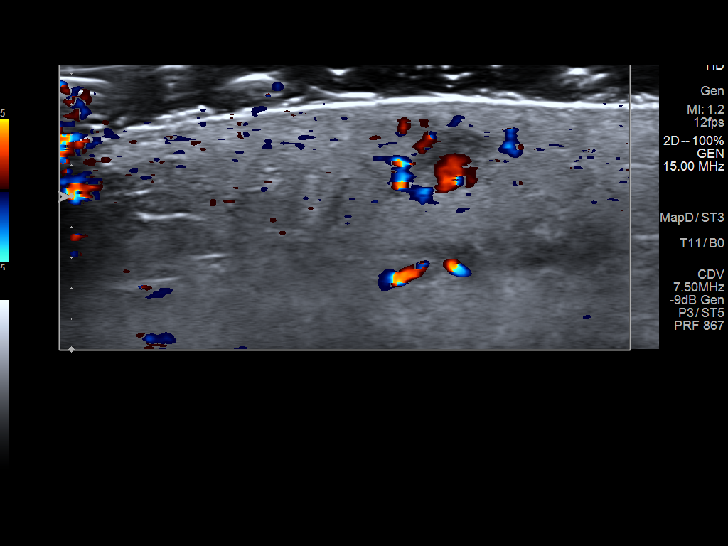
[im 59/59]
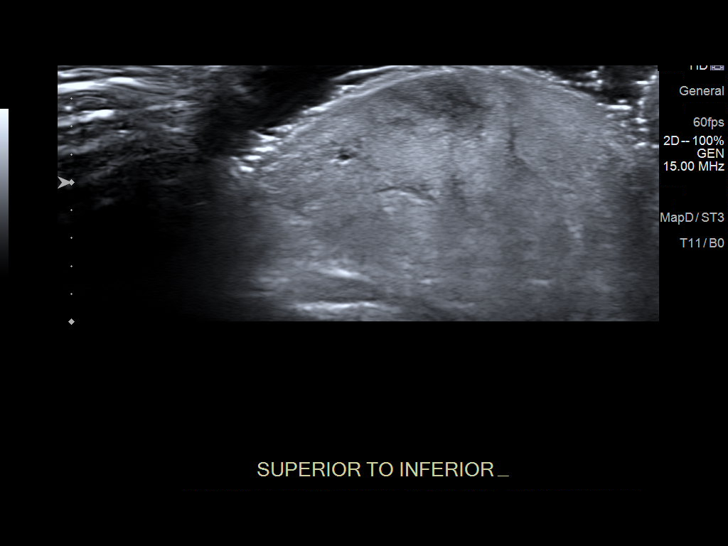

[13 of 25 positions shown; findings below may reference images not displayed]

FINDINGS: Right testicle

Measurements: 4.6 x 2.4 x 3.3 cm. No mass or microlithiasis
visualized.

Left testicle

Measurements: 4.7 x 2.7 x 2.7 cm. No mass or microlithiasis
visualized.

Right epididymis:  Normal in size and appearance.

Left epididymis: Normal in size and appearance. Incidental
subcentimeter epididymal cysts.

Hydrocele:  None visualized.  Trace, physiologic volume of fluid.

Varicocele:  None visualized.

Pulsed Doppler interrogation of both testes demonstrates normal low
resistance arterial and venous waveforms bilaterally.

Other: In the soft tissue lateral to the left scrotum at the patient
identified site of palpable abnormality, there is a superficial
hypoechoic, ill-defined subcutaneous lesion measuring 0.9 x 0.5 x
0.6 cm.
IMPRESSION: 1. Normal size and appearance of the bilateral testicles. No
evidence of scrotal or testicular mass.

2. In the soft tissue lateral to the left scrotum at the patient
identified site of palpable abnormality, there is a superficial
hypoechoic, ill-defined subcutaneous lesion measuring 0.9 x 0.5 x
0.6 cm. This is of uncertain nature given reported lack of acute
pain and local infectious symptoms, although may reflect a small
developing boil or other cutaneous process.

## 2022-09-19 ENCOUNTER — Ambulatory Visit: Payer: BC Managed Care – PPO

## 2022-09-19 DIAGNOSIS — K640 First degree hemorrhoids: Secondary | ICD-10-CM

## 2022-09-19 DIAGNOSIS — K5730 Diverticulosis of large intestine without perforation or abscess without bleeding: Secondary | ICD-10-CM

## 2022-09-19 DIAGNOSIS — K635 Polyp of colon: Secondary | ICD-10-CM | POA: Diagnosis not present

## 2023-09-11 ENCOUNTER — Other Ambulatory Visit: Payer: Self-pay | Admitting: Urology

## 2023-09-11 DIAGNOSIS — N402 Nodular prostate without lower urinary tract symptoms: Secondary | ICD-10-CM

## 2023-09-11 DIAGNOSIS — R972 Elevated prostate specific antigen [PSA]: Secondary | ICD-10-CM

## 2023-09-12 ENCOUNTER — Encounter: Payer: Self-pay | Admitting: Urology

## 2023-09-26 ENCOUNTER — Ambulatory Visit
Admission: RE | Admit: 2023-09-26 | Discharge: 2023-09-26 | Disposition: A | Discharge status: Home / Self Care | Source: Ambulatory Visit | Attending: Urology | Admitting: Urology

## 2023-09-26 DIAGNOSIS — R972 Elevated prostate specific antigen [PSA]: Secondary | ICD-10-CM

## 2023-09-26 DIAGNOSIS — N402 Nodular prostate without lower urinary tract symptoms: Secondary | ICD-10-CM

## 2023-09-26 MED ORDER — GADOPICLENOL 0.5 MMOL/ML IV SOLN
10.0000 mL | Freq: Once | INTRAVENOUS | Status: AC | PRN
  Administered 2023-09-26: 10 mL via INTRAVENOUS

## 2023-10-21 ENCOUNTER — Other Ambulatory Visit (HOSPITAL_COMMUNITY): Payer: Self-pay | Admitting: Urology

## 2023-10-21 DIAGNOSIS — C61 Malignant neoplasm of prostate: Secondary | ICD-10-CM

## 2023-10-28 ENCOUNTER — Other Ambulatory Visit: Payer: Self-pay | Admitting: Urology

## 2023-10-28 ENCOUNTER — Encounter (HOSPITAL_COMMUNITY)
Admission: RE | Admit: 2023-10-28 | Discharge: 2023-10-28 | Disposition: A | Discharge status: Home / Self Care | Source: Ambulatory Visit | Attending: Urology | Admitting: Urology

## 2023-10-28 DIAGNOSIS — C61 Malignant neoplasm of prostate: Secondary | ICD-10-CM | POA: Diagnosis present

## 2023-10-28 MED ORDER — FLOTUFOLASTAT F 18 GALLIUM 296-5846 MBQ/ML IV SOLN
8.8000 | Freq: Once | INTRAVENOUS | Status: AC
  Administered 2023-10-28: 8.8 via INTRAVENOUS

## 2023-10-30 ENCOUNTER — Other Ambulatory Visit: Payer: Self-pay | Admitting: Urology

## 2023-12-01 NOTE — Progress Notes (Signed)
 COVID Vaccine received:  []  No [x]  Yes Date of any COVID positive Test in last 90 days: none  PCP - Lynwood Null, MD , [facility]  Cardiologist -  none  Chest x-ray -  EKG -  12-03-23  Epic Stress Test -  ECHO -  Cardiac Cath -  CT Coronary Calcium score:   Bowel Prep - []  No  [x]   Yes __1 Fleet the morning of surgery  Pacemaker / ICD device [x]  No []  Yes   Spinal Cord Stimulator:[x]  No []  Yes       History of Sleep Apnea? [x]  No []  Yes   CPAP used?- [x]  No []  Yes    Does the patient monitor blood sugar?   [x]  N/A   []  No []  Yes  Patient has: [x]  NO Hx DM   []  Pre-DM   []  DM1  []   DM2  Blood Thinner / Instructions:   none Aspirin Instructions:   none  ERAS Protocol Ordered: [x]  No  []  Yes Patient is to be NPO after: MN Prior  Dental hx: []  Dentures:  [x]  N/A      []  Bridge or Partial:                   []  Loose or Damaged teeth:   Activity level: Able to walk up 2 flights of stairs without becoming significantly short of breath or having chest pain?  []  No   [x]    Yes   Anesthesia review: HLD, FH early CAD- mother had MI, anemia  Patient denies any S&S of respiratory illness or Covid - no shortness of breath, fever, cough or chest pain at PAT appointment.  Patient verbalized understanding and agreement to the Pre-Surgical Instructions that were given to them at this PAT appointment. Patient was also educated of the need to review these PAT instructions again prior to his surgery.I reviewed the appropriate phone numbers to call if they have any and questions or concerns.

## 2023-12-01 NOTE — Patient Instructions (Signed)
 SURGICAL WAITING ROOM VISITATION Patients having surgery or a procedure may have no more than 2 support people in the waiting area - these visitors may rotate in the visitor waiting room.   If the patient needs to stay at the hospital during part of their recovery, the visitor guidelines for inpatient rooms apply.  PRE-OP VISITATION  Pre-op nurse will coordinate an appropriate time for 1 support person to accompany the patient in pre-op.  This support person may not rotate.  This visitor will be contacted when the time is appropriate for the visitor to come back in the pre-op area.  Please refer to the [facility] website for the visitor guidelines for Inpatients (after your surgery is over and you are in a regular room).  You are not required to quarantine at this time prior to your surgery. However, you must do this: Hand Hygiene often Do NOT share personal items Notify your provider if you are in close contact with someone who has COVID or you develop fever 100.4 or greater, new onset of sneezing, cough, sore throat, shortness of breath or body aches.  If you test positive for Covid or have been in contact with anyone that has tested positive in the last 10 days please notify you surgeon.    Your procedure is scheduled on:  THURSDAY  December 12, 2023  Report to [facility] Main Entrance: [address] entrance where the [employer] is available.   Report to admitting at: 05:15    AM  Call this number if you have any questions or problems the morning of surgery [phone]  DO NOT EAT OR DRINK ANYTHING AFTER MIDNIGHT THE NIGHT PRIOR TO YOUR SURGERY / PROCEDURE.   FOLLOW  ANY ADDITIONAL PRE OP INSTRUCTIONS YOU RECEIVED FROM YOUR SURGEON'S OFFICE!!!  FLEET ENEMA: Obtain one(1) Fleet Enema (sodium phosphate  7-19 gm / 118 ml enema) and use (according to the directions on the box) the morning of your surgery.     Oral Hygiene is also important to reduce your risk of infection.         Remember - BRUSH YOUR TEETH THE MORNING OF SURGERY WITH YOUR REGULAR TOOTHPASTE  Do NOT smoke after Midnight the night before surgery.  STOP TAKING all Vitamins, Herbs and supplements 1 week before your surgery.   Take ONLY these medicines the morning of surgery with A SIP OF WATER: none                    You may not have any metal on your body including  jewelry, and body piercing  Do not wear  lotions, powders, cologne, or deodorant  Men may shave face and neck.  Contacts, Hearing Aids, dentures or bridgework may not be worn into surgery. DENTURES WILL BE REMOVED PRIOR TO SURGERY PLEASE DO NOT APPLY Poly grip OR ADHESIVES!!!  You may bring a small overnight bag with you on the day of surgery, only pack items that are not valuable. [facility] IS NOT RESPONSIBLE   FOR VALUABLES THAT ARE LOST OR STOLEN.   Patients discharged on the day of surgery will not be allowed to drive home.  Someone NEEDS to stay with you for the first 24 hours after anesthesia.  Do not bring your home medications to the hospital. The Pharmacy will dispense medications listed on your medication list to you during your admission in the Hospital.  Special Instructions: Bring a copy of your healthcare power of attorney and living will  documents the day of surgery, if you wish to have them scanned into your [place] [facility] Records- EPIC  Please read over the following fact sheets you were given: IF YOU HAVE QUESTIONS ABOUT YOUR PRE-OP INSTRUCTIONS, PLEASE CALL [phone].   [facility] - Preparing for Surgery Before surgery, you can play an important role.  Because skin is not sterile, your skin needs to be as free of germs as possible.  You can reduce the number of germs on your skin by washing with CHG (chlorahexidine gluconate) soap before surgery.  CHG is an antiseptic cleaner which kills germs and bonds with the skin to continue killing germs even after washing. Please DO NOT use if you have an allergy  to CHG or antibacterial soaps.  If your skin becomes reddened/irritated stop using the CHG and inform your nurse when you arrive at Short Stay. Do not shave (including legs and underarms) for at least 48 hours prior to the first CHG shower.  You may shave your face/neck.  Please follow these instructions carefully:  1.  Shower with CHG Soap the night before surgery and the  morning of surgery.  2.  If you choose to wash your hair, wash your hair first as usual with your normal  shampoo.  3.  After you shampoo, rinse your hair and body thoroughly to remove the shampoo.                             4.  Use CHG as you would any other liquid soap.  You can apply chg directly to the skin and wash.  Gently with a scrungie or clean washcloth.  5.  Apply the CHG Soap to your body ONLY FROM THE NECK DOWN.   Do not use on face/ open                           Wound or open sores. Avoid contact with eyes, ears mouth and genitals (private parts).                       Wash face,  Genitals (private parts) with your normal soap.             6.  Wash thoroughly, paying special attention to the area where your  surgery  will be performed.  7.  Thoroughly rinse your body with warm water from the neck down.  8.  DO NOT shower/wash with your normal soap after using and rinsing off the CHG Soap.            9.  Pat yourself dry with a clean towel.            10.  Wear clean pajamas.            11.  Place clean sheets on your bed the night of your first shower and do not  sleep with pets.  ON THE DAY OF SURGERY : Do not apply any lotions/deodorants the morning of surgery.  Please wear clean clothes to the hospital/surgery center.    FAILURE TO FOLLOW THESE INSTRUCTIONS MAY RESULT IN THE CANCELLATION OF YOUR SURGERY  PATIENT SIGNATURE_________________________________  NURSE SIGNATURE__________________________________  ________________________________________________________________________

## 2023-12-03 ENCOUNTER — Other Ambulatory Visit: Payer: Self-pay

## 2023-12-03 ENCOUNTER — Encounter (HOSPITAL_COMMUNITY): Payer: Self-pay

## 2023-12-03 ENCOUNTER — Encounter (HOSPITAL_COMMUNITY)
Admission: RE | Admit: 2023-12-03 | Discharge: 2023-12-03 | Disposition: A | Discharge status: Home / Self Care | Source: Ambulatory Visit | Attending: Urology | Admitting: Urology

## 2023-12-03 VITALS — BP 127/87 | HR 64 | Temp 98.5°F | Resp 16 | Ht 72.0 in | Wt 211.0 lb

## 2023-12-03 DIAGNOSIS — E785 Hyperlipidemia, unspecified: Secondary | ICD-10-CM | POA: Diagnosis not present

## 2023-12-03 DIAGNOSIS — Z8249 Family history of ischemic heart disease and other diseases of the circulatory system: Secondary | ICD-10-CM | POA: Diagnosis not present

## 2023-12-03 DIAGNOSIS — Z01818 Encounter for other preprocedural examination: Secondary | ICD-10-CM | POA: Diagnosis present

## 2023-12-03 HISTORY — DX: Family history of ischemic heart disease and other diseases of the circulatory system: Z82.49

## 2023-12-03 LAB — CBC
HCT: 43.2 % (ref 39.0–52.0)
Hemoglobin: 14.9 g/dL (ref 13.0–17.0)
MCH: 31.8 pg (ref 26.0–34.0)
MCHC: 34.5 g/dL (ref 30.0–36.0)
MCV: 92.1 fL (ref 80.0–100.0)
Platelets: 193 K/uL (ref 150–400)
RBC: 4.69 MIL/uL (ref 4.22–5.81)
RDW: 11.9 % (ref 11.5–15.5)
WBC: 5.9 K/uL (ref 4.0–10.5)
nRBC: 0 % (ref 0.0–0.2)

## 2023-12-03 LAB — BASIC METABOLIC PANEL WITH GFR
Anion gap: 6 (ref 5–15)
BUN: 14 mg/dL (ref 8–23)
CO2: 27 mmol/L (ref 22–32)
Calcium: 8.8 mg/dL — ABNORMAL LOW (ref 8.9–10.3)
Chloride: 102 mmol/L (ref 98–111)
Creatinine, Ser: 0.86 mg/dL (ref 0.61–1.24)
GFR, Estimated: >60 mL/min (ref >60)
Glucose, Bld: 100 mg/dL — ABNORMAL HIGH (ref 70–99)
Potassium: 3.5 mmol/L (ref 3.5–5.1)
Sodium: 135 mmol/L (ref 135–145)

## 2023-12-04 LAB — URINE CULTURE: Culture: NO GROWTH

## 2023-12-11 NOTE — Anesthesia Preprocedure Evaluation (Signed)
 Anesthesia Evaluation  Patient identified by MRN, date of birth, ID band Patient awake    Reviewed: Allergy & Precautions, NPO status , Patient's Chart, lab work & pertinent test results  Airway Mallampati: II  TM Distance: >3 FB Neck ROM: Full    Dental no notable dental hx. (+) Dental Advisory Given, Teeth Intact   Pulmonary former smoker   Pulmonary exam normal breath sounds clear to auscultation       Cardiovascular (-) hypertension(-) angina (-) Past MI negative cardio ROS Normal cardiovascular exam Rhythm:Regular Rate:Normal     Neuro/Psych negative neurological ROS  negative psych ROS   GI/Hepatic negative GI ROS, Neg liver ROS,,,  Endo/Other  negative endocrine ROS    Renal/GU Lab Results      Component                Value               Date                               K                        3.5                 12/03/2023                CO2                      27                  12/03/2023                BUN                      14                  12/03/2023                CREATININE               0.86                12/03/2023                Elevated PSA    Musculoskeletal negative musculoskeletal ROS (+)    Abdominal   Peds  Hematology Lab Results      Component                Value               Date                          HGB                      14.9                12/03/2023                HCT                      43.2                12/03/2023  MCV                      92.1                12/03/2023                PLT                      193                 12/03/2023              Anesthesia Other Findings NKDA  Reproductive/Obstetrics                              Anesthesia Physical Anesthesia Plan  ASA: 2  Anesthesia Plan: General   Post-op Pain Management: Precedex , Lidocaine  infusion* and Ofirmev  IV (intra-op)*   Induction:  Intravenous  PONV Risk Score and Plan: 3 and Treatment may vary due to age or medical condition, Midazolam , Ondansetron  and Dexamethasone   Airway Management Planned: Oral ETT  Additional Equipment: None  Intra-op Plan:   Post-operative Plan: Extubation in OR  Informed Consent: I have reviewed the patients History and Physical, chart, labs and discussed the procedure including the risks, benefits and alternatives for the proposed anesthesia with the patient or authorized representative who has indicated his/her understanding and acceptance.     Dental advisory given  Plan Discussed with: CRNA and Surgeon  Anesthesia Plan Comments:          Anesthesia Quick Evaluation

## 2023-12-12 ENCOUNTER — Encounter (HOSPITAL_COMMUNITY): Payer: Self-pay | Admitting: Urology

## 2023-12-12 ENCOUNTER — Ambulatory Visit (HOSPITAL_COMMUNITY): Payer: Self-pay | Admitting: Anesthesiology

## 2023-12-12 ENCOUNTER — Observation Stay (HOSPITAL_COMMUNITY)
Admission: RE | Admit: 2023-12-12 | Discharge: 2023-12-13 | Disposition: A | Discharge status: Home / Self Care | Attending: Urology | Admitting: Urology

## 2023-12-12 ENCOUNTER — Other Ambulatory Visit: Payer: Self-pay

## 2023-12-12 ENCOUNTER — Encounter (HOSPITAL_COMMUNITY)
Admission: RE | Disposition: A | Discharge status: Home / Self Care | Payer: Self-pay | Source: Home / Self Care | Attending: Urology

## 2023-12-12 ENCOUNTER — Ambulatory Visit (HOSPITAL_BASED_OUTPATIENT_CLINIC_OR_DEPARTMENT_OTHER): Payer: Self-pay | Admitting: Anesthesiology

## 2023-12-12 DIAGNOSIS — Z87891 Personal history of nicotine dependence: Secondary | ICD-10-CM | POA: Insufficient documentation

## 2023-12-12 DIAGNOSIS — C61 Malignant neoplasm of prostate: Principal | ICD-10-CM | POA: Diagnosis present

## 2023-12-12 DIAGNOSIS — C253 Malignant neoplasm of pancreatic duct: Secondary | ICD-10-CM | POA: Diagnosis not present

## 2023-12-12 HISTORY — PX: ROBOT ASSISTED LAPAROSCOPIC RADICAL PROSTATECTOMY: SHX5141

## 2023-12-12 LAB — BASIC METABOLIC PANEL WITH GFR
Anion gap: 14 (ref 5–15)
BUN: 13 mg/dL (ref 8–23)
CO2: 19 mmol/L — ABNORMAL LOW (ref 22–32)
Calcium: 8.8 mg/dL — ABNORMAL LOW (ref 8.9–10.3)
Chloride: 107 mmol/L (ref 98–111)
Creatinine, Ser: 0.83 mg/dL (ref 0.61–1.24)
GFR, Estimated: >60 mL/min
Glucose, Bld: 127 mg/dL — ABNORMAL HIGH (ref 70–99)
Potassium: 3.9 mmol/L (ref 3.5–5.1)
Sodium: 140 mmol/L (ref 135–145)

## 2023-12-12 LAB — ABO/RH: ABO/RH(D): O NEG

## 2023-12-12 LAB — TYPE AND SCREEN
ABO/RH(D): O NEG
Antibody Screen: NEGATIVE

## 2023-12-12 LAB — HEMOGLOBIN AND HEMATOCRIT, BLOOD
HCT: 44.2 % (ref 39.0–52.0)
Hemoglobin: 14.8 g/dL (ref 13.0–17.0)

## 2023-12-12 LAB — HIV ANTIBODY (ROUTINE TESTING W REFLEX): HIV Screen 4th Generation wRfx: NONREACTIVE

## 2023-12-12 SURGERY — PROSTATECTOMY, RADICAL, ROBOT-ASSISTED, LAPAROSCOPIC
Anesthesia: General

## 2023-12-12 MED ORDER — FENTANYL CITRATE (PF) 100 MCG/2ML IJ SOLN
INTRAMUSCULAR | Status: DC | PRN
  Administered 2023-12-12 (×3): 50 ug via INTRAVENOUS

## 2023-12-12 MED ORDER — ROCURONIUM BROMIDE 100 MG/10ML IV SOLN
INTRAVENOUS | Status: DC | PRN
  Administered 2023-12-12: 10 mg via INTRAVENOUS
  Administered 2023-12-12: 20 mg via INTRAVENOUS
  Administered 2023-12-12: 15 mg via INTRAVENOUS
  Administered 2023-12-12: 10 mg via INTRAVENOUS
  Administered 2023-12-12: 60 mg via INTRAVENOUS
  Administered 2023-12-12 (×2): 10 mg via INTRAVENOUS

## 2023-12-12 MED ORDER — PROPOFOL 10 MG/ML IV BOLUS
INTRAVENOUS | Status: AC
  Filled 2023-12-12: qty 20

## 2023-12-12 MED ORDER — SIMVASTATIN 40 MG PO TABS
40.0000 mg | ORAL_TABLET | Freq: Every day | ORAL | Status: DC
  Administered 2023-12-12: 40 mg via ORAL
  Filled 2023-12-12: qty 1

## 2023-12-12 MED ORDER — SODIUM CHLORIDE 0.9 % IV SOLN: INTRAVENOUS | Status: DC

## 2023-12-12 MED ORDER — ROCURONIUM BROMIDE 10 MG/ML (PF) SYRINGE
PREFILLED_SYRINGE | INTRAVENOUS | Status: AC
  Filled 2023-12-12: qty 10

## 2023-12-12 MED ORDER — BUPIVACAINE LIPOSOME 1.3 % IJ SUSP
INTRAMUSCULAR | Status: AC
  Filled 2023-12-12: qty 20

## 2023-12-12 MED ORDER — KETOROLAC TROMETHAMINE 15 MG/ML IJ SOLN
15.0000 mg | Freq: Four times a day (QID) | INTRAMUSCULAR | Status: DC
  Administered 2023-12-12 – 2023-12-13 (×3): 15 mg via INTRAVENOUS
  Filled 2023-12-12 (×3): qty 1

## 2023-12-12 MED ORDER — OXYCODONE HCL 5 MG/5ML PO SOLN: 5.0000 mg | Freq: Once | ORAL | Status: DC | PRN

## 2023-12-12 MED ORDER — PHENYLEPHRINE 80 MCG/ML (10ML) SYRINGE FOR IV PUSH (FOR BLOOD PRESSURE SUPPORT)
PREFILLED_SYRINGE | INTRAVENOUS | Status: DC | PRN
  Administered 2023-12-12: 120 ug via INTRAVENOUS
  Administered 2023-12-12: 160 ug via INTRAVENOUS
  Administered 2023-12-12 (×2): 120 ug via INTRAVENOUS
  Administered 2023-12-12: 160 ug via INTRAVENOUS
  Administered 2023-12-12 (×2): 120 ug via INTRAVENOUS

## 2023-12-12 MED ORDER — PHENYLEPHRINE HCL-NACL 20-0.9 MG/250ML-% IV SOLN
INTRAVENOUS | Status: AC
  Filled 2023-12-12: qty 250

## 2023-12-12 MED ORDER — DEXAMETHASONE SODIUM PHOSPHATE 10 MG/ML IJ SOLN
INTRAMUSCULAR | Status: AC
  Filled 2023-12-12: qty 1

## 2023-12-12 MED ORDER — SENNA 8.6 MG PO TABS
1.0000 | ORAL_TABLET | Freq: Two times a day (BID) | ORAL | Status: DC
  Administered 2023-12-12 – 2023-12-13 (×2): 8.6 mg via ORAL
  Filled 2023-12-12 (×2): qty 1

## 2023-12-12 MED ORDER — BUPIVACAINE-EPINEPHRINE 0.5% -1:200000 IJ SOLN
INTRAMUSCULAR | Status: DC | PRN
  Administered 2023-12-12: 30 mL

## 2023-12-12 MED ORDER — LIDOCAINE 2% (20 MG/ML) 5 ML SYRINGE
INTRAMUSCULAR | Status: DC | PRN
Start: 2023-12-12 — End: 2023-12-12
  Administered 2023-12-12: 60 mg via INTRAVENOUS
  Administered 2023-12-12: 1 mg/kg/h via INTRAVENOUS

## 2023-12-12 MED ORDER — ACETAMINOPHEN 10 MG/ML IV SOLN: 1000.0000 mg | Freq: Once | INTRAVENOUS | Status: DC | PRN

## 2023-12-12 MED ORDER — AMISULPRIDE (ANTIEMETIC) 5 MG/2ML IV SOLN: 10.0000 mg | Freq: Once | INTRAVENOUS | Status: DC | PRN

## 2023-12-12 MED ORDER — SUGAMMADEX SODIUM 200 MG/2ML IV SOLN
INTRAVENOUS | Status: DC | PRN
  Administered 2023-12-12: 200 mg via INTRAVENOUS

## 2023-12-12 MED ORDER — LIDOCAINE HCL (PF) 2 % IJ SOLN
INTRAMUSCULAR | Status: AC
  Filled 2023-12-12: qty 5

## 2023-12-12 MED ORDER — OXYCODONE HCL 5 MG PO TABS: 5.0000 mg | ORAL_TABLET | Freq: Once | ORAL | Status: DC | PRN

## 2023-12-12 MED ORDER — ONDANSETRON HCL 4 MG/2ML IJ SOLN: 4.0000 mg | Freq: Once | INTRAMUSCULAR | Status: DC | PRN

## 2023-12-12 MED ORDER — PHENYLEPHRINE 80 MCG/ML (10ML) SYRINGE FOR IV PUSH (FOR BLOOD PRESSURE SUPPORT)
PREFILLED_SYRINGE | INTRAVENOUS | Status: AC
Start: 2023-12-12 — End: 2023-12-12
  Filled 2023-12-12: qty 10

## 2023-12-12 MED ORDER — ONDANSETRON HCL 4 MG/2ML IJ SOLN
INTRAMUSCULAR | Status: AC
  Filled 2023-12-12: qty 2

## 2023-12-12 MED ORDER — ONDANSETRON HCL 4 MG/2ML IJ SOLN
INTRAMUSCULAR | Status: DC | PRN
  Administered 2023-12-12: 4 mg via INTRAVENOUS

## 2023-12-12 MED ORDER — STERILE WATER FOR IRRIGATION IR SOLN
Status: DC | PRN
  Administered 2023-12-12: 1000 mL

## 2023-12-12 MED ORDER — ORAL CARE MOUTH RINSE: 15.0000 mL | Freq: Once | OROMUCOSAL | Status: AC

## 2023-12-12 MED ORDER — HEMOSTATIC AGENTS (NO CHARGE) OPTIME
TOPICAL | Status: DC | PRN
  Administered 2023-12-12: 1 via TOPICAL

## 2023-12-12 MED ORDER — TRAMADOL HCL 50 MG PO TABS: 50.0000 mg | ORAL_TABLET | Freq: Four times a day (QID) | ORAL | 0 refills | Status: DC | PRN

## 2023-12-12 MED ORDER — BUPIVACAINE-EPINEPHRINE (PF) 0.5% -1:200000 IJ SOLN
INTRAMUSCULAR | Status: AC
  Filled 2023-12-12: qty 30

## 2023-12-12 MED ORDER — TRAMADOL HCL 50 MG PO TABS: 50.0000 mg | ORAL_TABLET | Freq: Two times a day (BID) | ORAL | Status: DC | PRN

## 2023-12-12 MED ORDER — FENTANYL CITRATE (PF) 100 MCG/2ML IJ SOLN
INTRAMUSCULAR | Status: AC
  Filled 2023-12-12: qty 2

## 2023-12-12 MED ORDER — DEXMEDETOMIDINE HCL IN NACL 80 MCG/20ML IV SOLN
INTRAVENOUS | Status: DC | PRN
  Administered 2023-12-12 (×2): 4 ug via INTRAVENOUS

## 2023-12-12 MED ORDER — PROPOFOL 10 MG/ML IV BOLUS
INTRAVENOUS | Status: DC | PRN
  Administered 2023-12-12: 150 mg via INTRAVENOUS

## 2023-12-12 MED ORDER — CEFAZOLIN SODIUM-DEXTROSE 1-4 GM/50ML-% IV SOLN
1.0000 g | Freq: Three times a day (TID) | INTRAVENOUS | Status: DC
  Administered 2023-12-12 – 2023-12-13 (×3): 1 g via INTRAVENOUS
  Filled 2023-12-12 (×4): qty 50

## 2023-12-12 MED ORDER — TRIPLE ANTIBIOTIC 3.5-400-5000 EX OINT: 1.0000 | TOPICAL_OINTMENT | Freq: Three times a day (TID) | CUTANEOUS | Status: DC | PRN

## 2023-12-12 MED ORDER — MIDAZOLAM HCL 2 MG/2ML IJ SOLN
INTRAMUSCULAR | Status: AC
  Filled 2023-12-12: qty 2

## 2023-12-12 MED ORDER — SULFAMETHOXAZOLE-TRIMETHOPRIM 800-160 MG PO TABS: 1.0000 | ORAL_TABLET | Freq: Two times a day (BID) | ORAL | 0 refills | Status: DC

## 2023-12-12 MED ORDER — DOCUSATE SODIUM 100 MG PO CAPS
100.0000 mg | ORAL_CAPSULE | Freq: Two times a day (BID) | ORAL | Status: DC
  Administered 2023-12-12 – 2023-12-13 (×2): 100 mg via ORAL
  Filled 2023-12-12 (×2): qty 1

## 2023-12-12 MED ORDER — CHLORHEXIDINE GLUCONATE 0.12 % MT SOLN
15.0000 mL | Freq: Once | OROMUCOSAL | Status: AC
  Administered 2023-12-12: 15 mL via OROMUCOSAL

## 2023-12-12 MED ORDER — SUGAMMADEX SODIUM 200 MG/2ML IV SOLN
INTRAVENOUS | Status: AC
  Filled 2023-12-12: qty 2

## 2023-12-12 MED ORDER — CEFAZOLIN SODIUM-DEXTROSE 2-4 GM/100ML-% IV SOLN
2.0000 g | Freq: Once | INTRAVENOUS | Status: AC
  Administered 2023-12-12: 2 g via INTRAVENOUS
  Filled 2023-12-12: qty 100

## 2023-12-12 MED ORDER — LACTATED RINGERS IV SOLN: INTRAVENOUS | Status: DC

## 2023-12-12 MED ORDER — MIDAZOLAM HCL 5 MG/5ML IJ SOLN
INTRAMUSCULAR | Status: DC | PRN
  Administered 2023-12-12: 2 mg via INTRAVENOUS

## 2023-12-12 MED ORDER — ACETAMINOPHEN 10 MG/ML IV SOLN
1000.0000 mg | Freq: Four times a day (QID) | INTRAVENOUS | Status: DC
  Administered 2023-12-12 – 2023-12-13 (×3): 1000 mg via INTRAVENOUS
  Filled 2023-12-12 (×3): qty 100

## 2023-12-12 MED ORDER — PHENYLEPHRINE 80 MCG/ML (10ML) SYRINGE FOR IV PUSH (FOR BLOOD PRESSURE SUPPORT)
PREFILLED_SYRINGE | INTRAVENOUS | Status: AC
  Filled 2023-12-12: qty 10

## 2023-12-12 MED ORDER — DEXAMETHASONE SODIUM PHOSPHATE 10 MG/ML IJ SOLN
INTRAMUSCULAR | Status: DC | PRN
  Administered 2023-12-12: 4 mg via INTRAVENOUS

## 2023-12-12 MED ORDER — LACTATED RINGERS IR SOLN
Status: DC | PRN
  Administered 2023-12-12: 1

## 2023-12-12 MED ORDER — HYDROMORPHONE HCL 1 MG/ML IJ SOLN: 0.2500 mg | INTRAMUSCULAR | Status: DC | PRN

## 2023-12-12 MED ORDER — SODIUM CHLORIDE (PF) 0.9 % IJ SOLN
INTRAMUSCULAR | Status: AC
  Filled 2023-12-12: qty 20

## 2023-12-12 MED ORDER — DOCUSATE SODIUM 100 MG PO CAPS: 100.0000 mg | ORAL_CAPSULE | Freq: Two times a day (BID) | ORAL | 2 refills | Status: DC

## 2023-12-12 MED ORDER — BUPIVACAINE LIPOSOME 1.3 % IJ SUSP
INTRAMUSCULAR | Status: DC | PRN
  Administered 2023-12-12: 20 mL

## 2023-12-12 MED ORDER — ONDANSETRON HCL 4 MG/2ML IJ SOLN
4.0000 mg | INTRAMUSCULAR | Status: DC | PRN
Start: 2023-12-12 — End: 2023-12-13

## 2023-12-12 SURGICAL SUPPLY — 52 items
APPLICATOR COTTON TIP 6 STRL (MISCELLANEOUS) ×1 IMPLANT
BAG COUNTER SPONGE SURGICOUNT (BAG) IMPLANT
CATH FOLEY 2WAY SLVR 18FR 30CC (CATHETERS) ×1 IMPLANT
CATH TIEMANN FOLEY 18FR 5CC (CATHETERS) ×1 IMPLANT
CHLORAPREP W/TINT 26 (MISCELLANEOUS) ×1 IMPLANT
CLIP LIGATING HEM O LOK PURPLE (MISCELLANEOUS) ×1 IMPLANT
COVER SURGICAL LIGHT HANDLE (MISCELLANEOUS) ×1 IMPLANT
COVER TIP SHEARS 8 DVNC (MISCELLANEOUS) ×1 IMPLANT
CUTTER ECHEON FLEX ENDO 45 340 (ENDOMECHANICALS) ×1 IMPLANT
DERMABOND ADVANCED .7 DNX12 (GAUZE/BANDAGES/DRESSINGS) ×1 IMPLANT
DRAPE ARM DVNC X/XI (DISPOSABLE) ×4 IMPLANT
DRAPE COLUMN DVNC XI (DISPOSABLE) ×1 IMPLANT
DRAPE SURG IRRIG POUCH 19X23 (DRAPES) ×1 IMPLANT
DRIVER NDL LRG 8 DVNC XI (INSTRUMENTS) ×2 IMPLANT
DRIVER NDLE LRG 8 DVNC XI (INSTRUMENTS) ×2 IMPLANT
DRSG TEGADERM 4X4.75 (GAUZE/BANDAGES/DRESSINGS) ×1 IMPLANT
ELECT PENCIL ROCKER SW 15FT (MISCELLANEOUS) ×1 IMPLANT
ELECT REM PT RETURN 15FT ADLT (MISCELLANEOUS) ×1 IMPLANT
FORCEPS BPLR LNG DVNC XI (INSTRUMENTS) ×1 IMPLANT
FORCEPS PROGRASP DVNC XI (FORCEP) ×1 IMPLANT
GAUZE 4X4 16PLY ~~LOC~~+RFID DBL (SPONGE) IMPLANT
GAUZE SPONGE 2X2 8PLY STRL LF (GAUZE/BANDAGES/DRESSINGS) IMPLANT
GAUZE SPONGE 4X4 12PLY STRL (GAUZE/BANDAGES/DRESSINGS) ×1 IMPLANT
GLOVE BIO SURGEON STRL SZ 6.5 (GLOVE) ×1 IMPLANT
GLOVE SURG LX STRL 7.5 STRW (GLOVE) ×2 IMPLANT
GOWN STRL REUS W/ TWL XL LVL3 (GOWN DISPOSABLE) ×2 IMPLANT
GOWN STRL SURGICAL XL XLNG (GOWN DISPOSABLE) ×1 IMPLANT
HEMOSTAT SURGICEL 4X8 (HEMOSTASIS) IMPLANT
HOLDER FOLEY CATH W/STRAP (MISCELLANEOUS) ×1 IMPLANT
IRRIGATION SUCT STRKRFLW 2 WTP (MISCELLANEOUS) ×1 IMPLANT
IV LACTATED RINGERS 1000ML (IV SOLUTION) ×1 IMPLANT
KIT TURNOVER KIT A (KITS) ×1 IMPLANT
PACK ROBOT UROLOGY CUSTOM (CUSTOM PROCEDURE TRAY) ×1 IMPLANT
PAD POSITIONING PINK XL (MISCELLANEOUS) ×1 IMPLANT
PLUG CATH AND CAP STRL 200 (CATHETERS) IMPLANT
RELOAD STAPLE 45 4.1 GRN THCK (STAPLE) ×1 IMPLANT
SCISSORS MNPLR CVD DVNC XI (INSTRUMENTS) ×1 IMPLANT
SEAL UNIV 5-12 XI (MISCELLANEOUS) ×4 IMPLANT
SET TUBE SMOKE EVAC HIGH FLOW (TUBING) ×1 IMPLANT
SOL PREP POV-IOD 4OZ 10% (MISCELLANEOUS) ×1 IMPLANT
SOLUTION ELECTROSURG ANTI STCK (MISCELLANEOUS) ×1 IMPLANT
SPIKE FLUID TRANSFER (MISCELLANEOUS) ×1 IMPLANT
SUT ETHILON 3 0 PS 1 (SUTURE) ×1 IMPLANT
SUT MNCRL AB 4-0 PS2 18 (SUTURE) ×2 IMPLANT
SUT VIC AB 0 CT1 27XBRD ANTBC (SUTURE) ×1 IMPLANT
SUT VIC AB 3-0 SH 27XBRD (SUTURE) IMPLANT
SUT VICRYL 0 UR6 27IN ABS (SUTURE) ×2 IMPLANT
SUTURE STRAT PDS 2-0 15 CT-2.5 (SUTURE) ×1 IMPLANT
SUTURE V-LC BRB 180 2/0GR6GS22 (SUTURE) ×1 IMPLANT
SUTURE VLOC BRB 180 ABS3/0GR12 (SUTURE) ×2 IMPLANT
TROCAR Z THREAD OPTICAL 12X100 (TROCAR) IMPLANT
WATER STERILE IRR 1000ML POUR (IV SOLUTION) ×1 IMPLANT

## 2023-12-12 NOTE — H&P (Signed)
 Prostate Cancer   Grade group 4  PSA: 7.24  DRE hard nodule in the right lateral lobe   5/25, prostate MRI: PI-RADS 5 lesion in the right posterior lateral posterior medial and anterior peripheral zone in the mid and apex gland which abuts the capsule, but does not appear to be through the capsule.  6/25, MRI fusion guided prostate biopsy: Gleason 4+4 = 8 in all right sided cores including 3/3 cores PI-RADS 5 lesion.   Patient's father was diagnosed with prostate cancer but died from other causes at the age of 76. His mother was diagnosed with breast cancer. She died of other causes as well.   The patient has minimal voiding symptoms.  The patient has some difficulty with erectile function, but is not using any sildenafil.   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     ALLERGIES: No Allergies    MEDICATIONS: Simvastatin  40 MG Tablet  Co Q 10  MiraLax 17 GM/SCOOP Powder     GU PSH: Prostate Needle Biopsy - 10/15/2023       PSH Notes: torn meniscus   NON-GU PSH: Surgical Pathology, Gross And Microscopic Examination For Prostate Needle - 10/15/2023     GU PMH: Elevated PSA - 10/15/2023, - 09/10/2023 Prostate nodule w/ LUTS - 09/10/2023    NON-GU PMH: Hypercholesterolemia    FAMILY HISTORY: 1 Daughter - Daughter 2 sons - Son prostate cancer in father - Father   SOCIAL HISTORY: Marital Status: Married Preferred Language: English; Race: White Current Smoking Status: Patient does not smoke anymore.   Tobacco Use Assessment Completed: Used Tobacco in last 30 days? Does drink.  Drinks 4+ caffeinated drinks per day.    REVIEW OF SYSTEMS:    GU Review Male:   Patient denies frequent urination, hard to postpone urination, burning/ pain with urination, get up at night to urinate, leakage of urine, stream starts and stops, trouble starting your stream, have to strain to urinate , erection problems, and penile pain.  Gastrointestinal (Upper):   Patient  denies nausea, vomiting, and indigestion/ heartburn.  Gastrointestinal (Lower):   Patient denies diarrhea and constipation.  Constitutional:   Patient denies fever, night sweats, weight loss, and fatigue.  Skin:   Patient denies itching and skin rash/ lesion.  Eyes:   Patient denies blurred vision and double vision.  Ears/ Nose/ Throat:   Patient denies sore throat and sinus problems.  Hematologic/Lymphatic:   Patient denies swollen glands and easy bruising.  Cardiovascular:   Patient denies leg swelling and chest pains.  Respiratory:   Patient denies cough and shortness of breath.  Endocrine:   Patient denies excessive thirst.  Musculoskeletal:   Patient denies back pain and joint pain.  Neurological:   Patient denies headaches and dizziness.  Psychologic:   Patient denies depression and anxiety.   VITAL SIGNS: None   Complexity of Data:  Source Of History:  Patient  Lab Test Review:   PSA  Records Review:   Pathology Reports, Previous Doctor Records, Previous Patient Records, POC Tool  Urine Test Review:   Urinalysis  Urodynamics Review:   Review Bladder Scan   09/10/23  PSA  Total PSA 7.24 ng/mL    PROCEDURES:          Visit Complexity - G2211    ASSESSMENT:      ICD-10 Details  1 GU:   Prostate Cancer - C61      PLAN:  Document Letter(s):  Created for Patient: Clinical Summary         Notes:   The patient has grade group 4/high risk prostate cancer. I went through the diagnosis with him in detail. He is still waiting for a PSMA scan, which we presume will be negative for metastatic disease. With that assumption we discussed management options including external beam radiation with 18 months of ADT versus radical prostatectomy. I reviewed the risk and benefits of both. Ultimately he would like to proceed with radical prostatectomy.   We discussed prostatectomy and specifically robotic prostatectomy with bilateral pelvic lymphadenectomy being the technique that  I most commonly perform. I showed the patient on their abdomen the approximately 6 small incision (trocar) sites as well as presumed extraction sites with robotic approach as well as possible open incision sites should open conversion be necessary. We discussed peri-operative risks including bleeding, infection, deep vein thrombosis, pulmonary embolism, compartment syndrome, nuropathy / neuropraxia, heart attack, stroke, death, as well as long-term risks such as non-cure / need for additional therapy. We specifically addressed that the procedure would compromise urinary control leading to stress incontinence which typically resolves with time and pelvic rehabilitation (Kegel's, etc..), but can sometimes be permanent and require additional therapy including surgery. We also specifically addressed sexual sequellae including significant erectile dysfunction which typically partially resolves with time but can also be permanent and require additional therapy including surgery.   We discussed the typical hospital course including usual 1-2 night hospitalization, discharge with foley catheter in place usually for 1-2 weeks before voiding trial as well as usually 2 week recovery until able to perform most non-strenuous activity and 6 weeks until able to return to most jobs and more strenuous activity such as exercise.

## 2023-12-12 NOTE — Plan of Care (Signed)

## 2023-12-12 NOTE — Interval H&P Note (Signed)
 History and Physical Interval Note:  12/12/2023 7:25 AM  Norman Ritter  has presented today for surgery, with the diagnosis of PROSTATE CANCER.  The various methods of treatment have been discussed with the patient and family. After consideration of risks, benefits and other options for treatment, the patient has consented to  Procedure(s) with comments: PROSTATECTOMY, RADICAL, ROBOT-ASSISTED, LAPAROSCOPIC (N/A) - ROBOT-ASSISTED LAPAROSCOPIC RADICAL PROSTATECTOMY LYMPHADENECTOMY, PELVIS, ROBOT-ASSISTED (N/A) - EXTENDED PELVIC LYMPH NODE DISSECTION as a surgical intervention.  The patient's history has been reviewed, patient examined, no change in status, stable for surgery.  I have reviewed the patient's chart and labs.  Questions were answered to the patient's satisfaction.     Morene LELON Salines

## 2023-12-12 NOTE — Discharge Instructions (Signed)

## 2023-12-12 NOTE — Op Note (Signed)
 Preoperative diagnosis:  Prostate Cancer   Postoperative diagnosis:  same   Procedure: Robotic assisted laparoscopic radical prostatectomy Bilateral pelvic lymph node dissection  Surgeon: Morene MICAEL Salines, MD First Assistant: Clifm Bound, MD  Anesthesia: General  Complications: None  Intraoperative findings:  #1 - left anterior peri-prostatic nodule removed #2 - margins grossly negative #3 - left nerve spare #4 - excellent vescio-urethral anastamosis  EBL: 150cc  Specimens:  #1.  Prostate and seminal vesicals #2.  Bilateral pelvic lymph nodes #3.  Left anterior peri-prostatic nodule  Indication: Norman Ritter is a 63 y.o. patient with prostate cancer.  After reviewing the management options for treatment, he elected to proceed with the removal of his prostate. We have discussed the potential benefits and risks of the procedure, side effects of the proposed treatment, the likelihood of the patient achieving the goals of the procedure, and any potential problems that might occur during the procedure or recuperation. Informed consent has been obtained.  Description of procedure:  The patient was consented in the preoperative holding area. He was in brought back to the operating room placed the table in supine position. General anesthesia was then induced and endotracheal tube was inserted. He was then placed in dorsolithotomy position and placed in steep Trendelenburg. He was then prepped and draped in the routine sterile fashion. We, the first assistant and I, then began by making a 10 mm incision supraumbilical midline incision the skin down through into the peritoneum. Then placed a 8 mm trocar. I then inflated the abdomen and inserted the 0 robotic lens. We then placed 2 additional a 8 millimeter trochars in the patient's left lower abdomen proximally 9 cm apart and 2 trochars on the patient's right lower abdomen, one was an 8 mm trocar and the one most lateral was a 12 mm  trocar which was used as the assistant port. A 5 mm trocar was placed by triangulating the 2 right lateral ports as a second assistant port. These ports were all placed under visual guidance. Once the ports were noted to be satisfactory position the robot was docked. We started with the 0 lens, monopolar scissors in the right hand and the [place]  forceps the left hand as well as a fenestrated grasper as the third arm on the left-hand side.   We, the first assistant and I,  began our dissection the posterior plane incising the peritoneum at the level of the vas deferens. Isolated the left vas deferens and dissected it proximally towards the spermatic cord for 5 cm prior to ligating it. Then used this as traction to isolate the left the seminal vesicle which was then undressed bluntly and completely dissected out, all vessels were cauterized with a combination of bipolar and the monopolar scissors. We then turned our attention to the right side and similarly dissected out the right vas deferens and seminal vesicle. Once the SVs had been freed, we turned our attention to the posterior plane and bluntly dissected the tissue between the rectum and the posterior wall of the prostate bluntly out towards the apex.    At this point the bladder was taken down starting at the urachal remnant with a combination of both blunt dissection and sharp dissection using monopolar cautery the bladder was dropped down in the usual fashion to the medial umbilical ligaments laterally and the dorsal vein of the prostate anteriorly creating our space of Retzius.  I gently dissected out the nodule in the left anterior peri-prostatic space and removed it intact  with surrounding fat.    We then turned our attention to the endopelvic fascia which was incised laterally starting on the patient's right-hand side the levator muscles were pushed off the prostate laterally up towards the dorsal vein complex on the right-hand side. This process  was then repeated on the left-hand side and a nice notch was created for the dorsal vein. I then used a 45mm stapler to staple the dorsal vein.   We,the first assistant and I, then located the bladder neck at the vesicoprostatic junction and using the monopolar scissors dissected down through the perivesical tissues and the bladder neck down to the prostatic urethra. The catheter was then deflated and pulled through our urethral opening and then used to retract the prostate anteriorly for the posterior bladder neck dissection. Once through the bladder neck and into the posterior plane of the prostate, the SVs were brought through the opening. The left pedicle was then isolated and systematically ligated with Weck clips and scissors. The nerve bundle was then peeled off the posterior lateral aspect of the prostate and bluntly dissected away off the prostate.     I then came down through the dorsal venous complex anteriorly down to the membranous urethra using the monopolar. Once down to the urethra, it was transected sharply and the apex of the prostate was then dissected off the levator and rectourethralis muscles. Once the apex of the prostate had been dissected free we came back to the base of the prostate and bluntly push the rectum and nerve vascular bundle off the prostate the patient's left and used clips on the patient's right to free the prostate. Once the prostate was free it was placed off to the side. The pelvis was then irrigated with normal saline and noted to be relatively hemostatic.  Attention was then turned to the right pelvic sidewall. The fibrofatty tissue between the external iliac vein, confluence of the iliac vessels, hypogastric artery, and Cooper's ligament was dissected free from the pelvic sidewall with care to preserve the obturator nerve. Weck clips were used for lymphostasis and hemostasis. An identical procedure was performed on the contralateral side and the lymphatic packets  were removed for permanent pathologic analysis.  The prostate and both lymph node tissues were placed in the Endo Catch bag and the string brought to the 5 mm port.    The vesicourethral anastomosis was then completed with 2 interlocking 3-0 V. lock sutures running the anastomosis in the 6:00 position to the 12:00 position on each side and then tying it off on the top. The final catheter was then passed through the patient's urethra and into the bladder and 120 cc was instilled into the bladder to test the anastomosis. As there was no leak a 17 [place] Blake drain was passed through the left lateral port and placed around the vesicourethral anastomosis. A 12 mm assistant port on the right lateral side was then closed with 0 Vicryl with the help of the [employer] needle. The 12 mm midline infraumbilical incision was then extended another centimeter taken down and the fascia opened to remove the Endo Catch bag with the prostate specimen. The fascia was then closed with a 0 Vicryl and all skin ports were closed with 4-0 Monocryl in a subcutaneous fashion. Dermabond glue was then applied to the incisions. The drain was then secured to the skin with a 0 nylon stitch and dressing applied.   At the end of the case all laps needles and sponges had  been accounted for. There no immediate complications. The patient returned to the PACU in stable condition.

## 2023-12-12 NOTE — Transfer of Care (Signed)
 Immediate Anesthesia Transfer of Care Note  Patient: Norman Ritter  Procedure(s) Performed: PROSTATECTOMY, RADICAL, ROBOT-ASSISTED, LAPAROSCOPIC LYMPHADENECTOMY, PELVIS, ROBOT-ASSISTED  Patient Location: PACU  Anesthesia Type:General  Level of Consciousness: awake, alert , and oriented  Airway & Oxygen Therapy: Patient Spontanous Breathing and Patient connected to face mask oxygen  Post-op Assessment: Report given to RN and Post -op Vital signs reviewed and stable  Post vital signs: Reviewed and stable  Last Vitals:  Vitals Value Taken Time  BP 143/95 12/12/23 12:31  Temp 36.4 C 12/12/23 12:30  Pulse 88 12/12/23 12:40  Resp 12 12/12/23 12:40  SpO2 100 % 12/12/23 12:40  Vitals shown include unfiled device data.  Last Pain:  Vitals:   12/12/23 0530  TempSrc: Oral         Complications: No notable events documented.

## 2023-12-12 NOTE — Anesthesia Procedure Notes (Addendum)
 Procedure Name: Intubation Date/Time: 12/12/2023 8:51 AM  Performed by: Gladis Honey, CRNAPre-anesthesia Checklist: Patient identified, Emergency Drugs available, Suction available and Patient being monitored Patient Re-evaluated:Patient Re-evaluated prior to induction Oxygen Delivery Method: Circle System Utilized Preoxygenation: Pre-oxygenation with 100% oxygen Induction Type: IV induction Ventilation: Mask ventilation without difficulty Laryngoscope Size: Miller and 2 Grade View: Grade I Tube type: Oral Tube size: 7.5 mm Number of attempts: 1 Airway Equipment and Method: Stylet and Oral airway Placement Confirmation: ETT inserted through vocal cords under direct vision, positive ETCO2 and breath sounds checked- equal and bilateral Secured at: 23 cm Tube secured with: Tape Dental Injury: Teeth and Oropharynx as per pre-operative assessment

## 2023-12-12 NOTE — Anesthesia Postprocedure Evaluation (Signed)
 Anesthesia Post Note  Patient: Norman Ritter  Procedure(s) Performed: PROSTATECTOMY, RADICAL, ROBOT-ASSISTED, LAPAROSCOPIC LYMPHADENECTOMY, PELVIS, ROBOT-ASSISTED     Patient location during evaluation: PACU Anesthesia Type: General Level of consciousness: awake and alert Pain management: pain level controlled Vital Signs Assessment: post-procedure vital signs reviewed and stable Respiratory status: spontaneous breathing, nonlabored ventilation, respiratory function stable and patient connected to nasal cannula oxygen Cardiovascular status: blood pressure returned to baseline and stable Postop Assessment: no apparent nausea or vomiting Anesthetic complications: no   No notable events documented.  Last Vitals:  Vitals:   12/12/23 1330 12/12/23 1357  BP: (!) 143/86 (!) 144/97  Pulse: 86 85  Resp: 20 14  Temp:  36.8 C  SpO2: 100% 100%    Last Pain:  Vitals:   12/12/23 1357  TempSrc: Oral  PainSc:                  Garnette DELENA Gab

## 2023-12-13 ENCOUNTER — Encounter (HOSPITAL_COMMUNITY): Payer: Self-pay | Admitting: Urology

## 2023-12-13 DIAGNOSIS — C253 Malignant neoplasm of pancreatic duct: Secondary | ICD-10-CM | POA: Diagnosis not present

## 2023-12-13 LAB — BASIC METABOLIC PANEL WITH GFR
Anion gap: 10 (ref 5–15)
BUN: 14 mg/dL (ref 8–23)
CO2: 24 mmol/L (ref 22–32)
Calcium: 8.2 mg/dL — ABNORMAL LOW (ref 8.9–10.3)
Chloride: 103 mmol/L (ref 98–111)
Creatinine, Ser: 0.98 mg/dL (ref 0.61–1.24)
GFR, Estimated: >60 mL/min (ref >60)
Glucose, Bld: 105 mg/dL — ABNORMAL HIGH (ref 70–99)
Potassium: 3.4 mmol/L — ABNORMAL LOW (ref 3.5–5.1)
Sodium: 137 mmol/L (ref 135–145)

## 2023-12-13 LAB — HEMOGLOBIN AND HEMATOCRIT, BLOOD
HCT: 36.8 % — ABNORMAL LOW (ref 39.0–52.0)
Hemoglobin: 12.9 g/dL — ABNORMAL LOW (ref 13.0–17.0)

## 2023-12-13 MED ORDER — CHLORHEXIDINE GLUCONATE CLOTH 2 % EX PADS: 6.0000 | MEDICATED_PAD | Freq: Every day | CUTANEOUS | Status: DC

## 2023-12-13 NOTE — Discharge Summary (Signed)
 Date of admission: 12/12/2023  Date of discharge: 12/13/2023  Admission diagnosis: Prostate cancer  Discharge diagnosis: Prostate cancer  Secondary diagnoses:  Patient Active Problem List   Diagnosis Date Noted   Prostate cancer (HCC) 12/12/2023    Procedures performed: Procedure(s): PROSTATECTOMY, RADICAL, ROBOT-ASSISTED, LAPAROSCOPIC LYMPHADENECTOMY, PELVIS, ROBOT-ASSISTED  History and Physical: For full details, please see admission history and physical. Briefly, Norman Ritter is a 63 y.o. year old patient with prostate cancer.   Hospital Course: Patient tolerated the procedure well.  He was then transferred to the floor after an uneventful PACU stay.  His hospital course was uncomplicated.  On POD#1 he had met discharge criteria: was eating a regular diet, was up and ambulating independently,  pain was well controlled, and was ready to for discharge.   Laboratory values:  Recent Labs    12/12/23 1256 12/13/23 0422  HGB 14.8 12.9*  HCT 44.2 36.8*   Recent Labs    12/12/23 1256 12/13/23 0422  NA 140 137  K 3.9 3.4*  CL 107 103  CO2 19* 24  GLUCOSE 127* 105*  BUN 13 14  CREATININE 0.83 0.98  CALCIUM 8.8* 8.2*   No results for input(s): LABPT, INR in the last 72 hours. No results for input(s): LABURIN in the last 72 hours. Results for orders placed or performed during the hospital encounter of 12/03/23  Urine Culture     Status: None   Collection Time: 12/03/23  8:38 AM   Specimen: Urine, Clean Catch  Result Value Ref Range Status   Specimen Description   Final    URINE, CLEAN CATCH Performed at [facility], [address] [place], [place] [address]    Special Requests   Final    NONE Performed at [facility], [address], [place], [place] [address]    Culture   Final    NO GROWTH Performed at [facility], [address], [place], [place] [address]    Report Status 12/04/2023 FINAL  Final     Physical Exam  Gen: NAD Resp: Satting well on RA Card: Regular rate Abd: Soft, appropriately tender, ND, incision clean dry and intact GU:  Foley catheter in place draining urine. Neuro: Alert   Disposition: Home  Discharge instruction: The patient was instructed to be ambulatory but told to refrain from heavy lifting, strenuous activity, or driving.   Discharge medications:  Allergies as of 12/13/2023   No Known Allergies      Medication List     STOP taking these medications    predniSONE  20 MG tablet Commonly known as: Deltasone        TAKE these medications    Coenzyme Q10 100 MG capsule Take 100 mg by mouth daily.   docusate sodium  100 MG capsule Commonly known as: Colace Take 1 capsule (100 mg total) by mouth 2 (two) times daily.   simvastatin  40 MG tablet Commonly known as: ZOCOR  Take 40 mg by mouth at bedtime.   sulfamethoxazole -trimethoprim  800-160 MG tablet Commonly known as: BACTRIM  DS Take 1 tablet by mouth 2 (two) times daily. Start taking one day prior to your appointment for your first follow-up and catheter removal.  Continue taking for three days.   traMADol  50 MG tablet Commonly known as: Ultram  Take 1-2 tablets (50-100 mg total) by mouth every 6 (six) hours as needed for moderate pain (pain score 4-6).        Followup:   Follow-up Information  Buck Bucy D, NP Follow up on 12/20/2023.   Specialty: Urology Why: 8:30A, Catheter removal Contact information: [address] [place] [address] [phone]

## 2023-12-13 NOTE — TOC Transition Note (Signed)
 Transition of Care [facility]) - Discharge Note   Patient Details  Name: Norman Ritter MRN: [id number] Date of Birth: [birth year]  [facility] CM/SW Contact:  Bascom Service, RN Phone Number: 12/13/2023, 10:17 AM   Clinical Narrative:  d/c home no CM needs.     Final next level of care: Home/Self Care Barriers to Discharge: No Barriers Identified   Patient Goals and CMS Choice            Discharge Placement                       Discharge Plan and Services Additional resources added to the After Visit Summary for                                       Social Drivers of Health (SDOH) Interventions SDOH Screenings   Food Insecurity: Patient Declined (12/12/2023)  Housing: Patient Declined (04/03/2023)   Received from [facility]  Transportation Needs: Patient Declined (04/03/2023)   [facility]  Utilities: Patient Declined (04/03/2023)   Received from [facility]  Financial Resource Strain: Patient Declined (04/03/2023)   Received from [facility]  Tobacco Use: Medium Risk (12/12/2023)     Readmission Risk Interventions     No data to display

## 2023-12-13 NOTE — Progress Notes (Signed)
 1 Day Post-Op Subjective: The patient is doing well.  No nausea or vomiting. Pain is adequately controlled.  Objective: Vital signs in last 24 hours: Temp:  [97.5 F (36.4 C)-99.4 F (37.4 C)] 98.4 F (36.9 C) (08/08 0425) Pulse Rate:  [85-103] 93 (08/08 0425) Resp:  [11-20] 18 (08/08 0425) BP: (131-155)/(86-97) 141/87 (08/08 0425) SpO2:  [95 %-100 %] 96 % (08/08 0425)  Intake/Output from previous day: 08/07 0701 - 08/08 0700 In: 3139.7 [P.O.:720; I.V.:2027.1; IV Piggyback:392.6] Out: 1855 [Urine:1500; Drains:155; Blood:200] Intake/Output this shift: No intake/output data recorded.  Physical Exam:  General: Alert and oriented. CV: RRR Lungs: Clear bilaterally. GI: Soft, Nondistended. Incisions: Clean, dry, and intact Urine: Clear Extremities: Nontender, no erythema, no edema.  Lab Results: Recent Labs    12/12/23 1256 12/13/23 0422  HGB 14.8 12.9*  HCT 44.2 36.8*      Assessment/Plan: POD# 1 s/p robotic prostatectomy.  1) SL IVF 2) Ambulate, Incentive spirometry 3) Transition to oral pain medication 4) D/C pelvic drain 5) Plan for likely discharge later today    LOS: 0 days   Norman Ritter 12/13/2023, 7:52 AM

## 2023-12-13 NOTE — Plan of Care (Signed)

## 2023-12-17 LAB — SURGICAL PATHOLOGY

## 2024-04-14 ENCOUNTER — Other Ambulatory Visit (HOSPITAL_COMMUNITY): Payer: Self-pay | Admitting: Urology

## 2024-04-14 DIAGNOSIS — C61 Malignant neoplasm of prostate: Secondary | ICD-10-CM

## 2024-05-05 ENCOUNTER — Encounter (HOSPITAL_COMMUNITY)
Admission: RE | Admit: 2024-05-05 | Discharge: 2024-05-05 | Disposition: A | Discharge status: Home / Self Care | Source: Ambulatory Visit | Attending: Urology | Admitting: Urology

## 2024-05-05 DIAGNOSIS — C61 Malignant neoplasm of prostate: Secondary | ICD-10-CM | POA: Insufficient documentation

## 2024-05-05 MED ORDER — FLOTUFOLASTAT F 18 GALLIUM 296-5846 MBQ/ML IV SOLN
8.4700 | Freq: Once | INTRAVENOUS | Status: AC
  Administered 2024-05-05: 8.47 via INTRAVENOUS

## 2024-05-06 NOTE — Progress Notes (Incomplete)
 GU Location of Tumor / Histology: Prostate Ca  Prostatectomy   PSA 2.54 on 03/30/2024  Norman Ritter presented as a referral from Dr. Morene MICAEL Salines [facility]) rising PSA.  Biopsies      05/05/2024 Dr. Morene MICAEL Salines NM PET (PSMA) Skull to Mid Thigh CLINICAL DATA:  Prostate cancer    10/28/2023 Dr. Morene MICAEL Salines NM PET (PSMA) Skull to Mid Thigh CLINICAL DATA:  Initial treatment strategy for prostate adenocarcinoma.   IMPRESSION: 1. Intense radiotracer activity in the RIGHT lateral lobe of the prostate gland consistent with primary prostate adenocarcinoma. 2. Rounded soft tissue nodule anterior to the LEFT lobe of the prostate gland and posterior the bladder has intense radiotracer activity consistent with a metastatic lymph node. 3. No evidence of distant metastatic disease. 4. No evidence of visceral metastasis or skeletal metastasis.  Past/Anticipated interventions by urology, if any:  Dr. Morene MICAEL Salines    Past/Anticipated interventions by medical oncology, if any: NA  Weight changes, if any: {:18581}  IPSS: SHIM:  Bowel/Bladder complaints, if any: {:18581}   Nausea/Vomiting, if any: {:18581}  Pain issues, if any:  {:18581}  SAFETY ISSUES: Prior radiation?  Pacemaker/ICD? {:18581} Possible current pregnancy? Male  Is the patient on methotrexate? No  Current Complaints / other details:    30 minutes spent total, including time for meaningful use questions, reviewing medication, as well as spent in face-to-face time in nurse evaluation with the patient.

## 2024-05-08 ENCOUNTER — Ambulatory Visit
Admission: RE | Admit: 2024-05-08 | Discharge: 2024-05-08 | Disposition: A | Discharge status: Home / Self Care | Source: Ambulatory Visit | Attending: Radiation Oncology | Admitting: Radiation Oncology

## 2024-05-08 ENCOUNTER — Encounter: Payer: Self-pay | Admitting: Radiation Oncology

## 2024-05-08 VITALS — BP 133/95 | HR 71 | Temp 97.9°F | Resp 18 | Ht 71.5 in | Wt 212.4 lb

## 2024-05-08 DIAGNOSIS — E785 Hyperlipidemia, unspecified: Secondary | ICD-10-CM | POA: Diagnosis not present

## 2024-05-08 DIAGNOSIS — C61 Malignant neoplasm of prostate: Secondary | ICD-10-CM | POA: Insufficient documentation

## 2024-05-08 DIAGNOSIS — Z79899 Other long term (current) drug therapy: Secondary | ICD-10-CM | POA: Diagnosis not present

## 2024-05-08 DIAGNOSIS — C775 Secondary and unspecified malignant neoplasm of intrapelvic lymph nodes: Secondary | ICD-10-CM | POA: Insufficient documentation

## 2024-05-08 DIAGNOSIS — Z87891 Personal history of nicotine dependence: Secondary | ICD-10-CM | POA: Insufficient documentation

## 2024-05-08 DIAGNOSIS — C7951 Secondary malignant neoplasm of bone: Secondary | ICD-10-CM | POA: Insufficient documentation

## 2024-05-08 NOTE — Consult Note (Incomplete)
 " Radiation Oncology         [phone] ________________________________  Initial Outpatient Consultation  Name: Norman Ritter MRN: [id number]  Date: 05/08/2024  DOB: [birth year]  RR:Yzimprx, [name], MD  Cam Morene ORN, MD   REFERRING PHYSICIAN: Cam Morene ORN, MD  DIAGNOSIS: 64 y.o. gentleman with a rising PSA of 1.1 s/p RALP 12/12/2023 for Stage pT3a, Gleason 4+5 prostate cancer  No diagnosis found.  HISTORY OF PRESENT ILLNESS: [name] is a 64 y.o. male with a diagnosis of prostate cancer. He was diagnosed with Gleason 4+5 prostate cancer on 12/12/2023 following his prostatectomy under Dr. Cam. Pathology revealed Gleason 4+5 prostatic adenocarcinoma with negative margins, 0/12 nodes, anterior left adjacent nodule (separate from prostate) Gleason 4+4 with cribriform glands. His postoperative PSA was 1.1.  The patient reviewed the pathology and PSA results with his urologist and he has kindly been referred today for discussion of potential radiation treatment options.   PREVIOUS RADIATION THERAPY: {EXAM; YES/NO:19492::No}  PAST MEDICAL HISTORY:  Past Medical History:  Diagnosis Date   Family history of early CAD    Mother had early onset CAD and MI   Hyperlipemia       PAST SURGICAL HISTORY: Past Surgical History:  Procedure Laterality Date   COLONOSCOPY W/ POLYPECTOMY     x 4   DENTAL SURGERY     propofol  anes.  for abscess   KNEE ARTHROSCOPY W/ DEBRIDEMENT Left 02/2021   ROBOT ASSISTED LAPAROSCOPIC RADICAL PROSTATECTOMY N/A 12/12/2023   Procedure: PROSTATECTOMY, RADICAL, ROBOT-ASSISTED, LAPAROSCOPIC;  Surgeon: Cam Morene ORN, MD;  Location: [facility];  Service: Urology;  Laterality: N/A;  ROBOT-ASSISTED LAPAROSCOPIC RADICAL PROSTATECTOMY    FAMILY HISTORY: No family history on file.  SOCIAL HISTORY:  Social History   Socioeconomic History   Marital status: Married    Spouse name: Not on file   Number of children: Not on file   Years of  education: Not on file   Highest education level: Not on file  Occupational History   Not on file  Tobacco Use   Smoking status: Former    Types: Cigarettes    Start date: 75    Quit date: 70    Years since quitting: 49.0   Smokeless tobacco: Never  Vaping Use   Vaping status: Never Used  Substance and Sexual Activity   Alcohol use: Yes    Comment: occasional   Drug use: Never   Sexual activity: Not Currently  Other Topics Concern   Not on file  Social History Narrative   Not on file   Social Drivers of Health   Tobacco Use: Medium Risk (04/06/2024)   Received from [facility]   Patient History    Smoking Tobacco Use: Former    Smokeless Tobacco Use: Never    Passive Exposure: Not on file  Financial Resource Strain: Low Risk  (04/03/2024)   Received from [facility]   Overall Financial Resource Strain (CARDIA)    Difficulty of Paying Living Expenses: Not hard at all  Food Insecurity: No Food Insecurity (04/03/2024)   Received from [facility]   Epic    Within the past 12 months, you worried that your food would run out before you got the money to buy more.: Never true    Within the past 12 months, the food you bought just didn't last and you didn't have money to get more.: Never true  Transportation Needs: Unknown (04/03/2024)   Received from  [facility]   PRAPARE - Transportation    In the past 12 months, has lack of transportation kept you from medical appointments or from getting medications?: No    Lack of Transportation (Non-Medical): Patient declined  Physical Activity: Not on file  Stress: Not on file  Social Connections: Not on file  Intimate Partner Violence: Not on file  Depression (EYV7-0): Not on file  Alcohol Screen: Not on file  Housing: Low Risk  (04/03/2024)   Received from [facility]   Epic    In the last 12 months, was there a time when you were not  able to pay the mortgage or rent on time?: No    In the past 12 months, how many times have you moved where you were living?: 0    At any time in the past 12 months, were you homeless or living in a shelter (including now)?: No  Utilities: Not At Risk (04/03/2024)   Received from [facility]   Epic    In the past 12 months has the electric, gas, oil, or water  company threatened to shut off services in your home?: No  Health Literacy: Not on file    ALLERGIES: Patient has no known allergies.  MEDICATIONS:  Current Outpatient Medications  Medication Sig Dispense Refill   Coenzyme Q10 100 MG capsule Take 100 mg by mouth daily.     docusate sodium  (COLACE) 100 MG capsule Take 1 capsule (100 mg total) by mouth 2 (two) times daily. 60 capsule 2   simvastatin  (ZOCOR ) 40 MG tablet Take 40 mg by mouth at bedtime.     sulfamethoxazole -trimethoprim  (BACTRIM  DS) 800-160 MG tablet Take 1 tablet by mouth 2 (two) times daily. Start taking one day prior to your appointment for your first follow-up and catheter removal.  Continue taking for three days. 6 tablet 0   traMADol  (ULTRAM ) 50 MG tablet Take 1-2 tablets (50-100 mg total) by mouth every 6 (six) hours as needed for moderate pain (pain score 4-6). 15 tablet 0   No current facility-administered medications for this encounter.    REVIEW OF SYSTEMS:  On review of systems, the patient reports that he is doing well overall. He denies any chest pain, shortness of breath, cough, fevers, chills, night sweats, unintended weight changes. He denies any bowel disturbances, and denies abdominal pain, nausea or vomiting. He denies any new musculoskeletal or joint aches or pains. His IPSS was ***, indicating *** urinary symptoms. His SHIM was ***, indicating he does not have***likely has postoperative erectile dysfunction. A complete review of systems is obtained and is otherwise negative.    PHYSICAL EXAM:  Wt Readings from Last 3 Encounters:   12/12/23 95.7 kg  12/03/23 95.7 kg  06/11/19 100.7 kg   Temp Readings from Last 3 Encounters:  12/13/23 98.4 F (36.9 C) (Oral)  12/03/23 98.5 F (36.9 C) (Oral)  05/14/18 99.1 F (37.3 C) (Oral)   BP Readings from Last 3 Encounters:  12/13/23 (!) 141/87  12/03/23 127/87  06/11/19 (!) 138/95   Pulse Readings from Last 3 Encounters:  12/13/23 93  12/03/23 64  06/11/19 82    /10  In general this is a well appearing *** man in no acute distress. He's alert and oriented x4 and appropriate throughout the examination. Cardiopulmonary assessment is negative for acute distress, and he exhibits normal effort.     KPS = ***  100 - Normal; no complaints; no evidence of disease.  90   - Able to carry on normal activity; minor signs or symptoms of disease. 80   - Normal activity with effort; some signs or symptoms of disease. 84   - Cares for self; unable to carry on normal activity or to do active work. 60   - Requires occasional assistance, but is able to care for most of his personal needs. 50   - Requires considerable assistance and frequent medical care. 40   - Disabled; requires special care and assistance. 30   - Severely disabled; hospital admission is indicated although death not imminent. 20   - Very sick; hospital admission necessary; active supportive treatment necessary. 10   - Moribund; fatal processes progressing rapidly. 0     - Dead  Karnofsky DA, Abelmann WH, Craver LS and Burchenal [facility] [phone]) The use of the nitrogen mustards in the palliative treatment of carcinoma: with particular reference to bronchogenic carcinoma Cancer 1 634-56  LABORATORY DATA:  Lab Results  Component Value Date   WBC 5.9 12/03/2023   HGB 12.9 (L) 12/13/2023   HCT 36.8 (L) 12/13/2023   MCV 92.1 12/03/2023   PLT 193 12/03/2023   Lab Results  Component Value Date   NA 137 12/13/2023   K 3.4 (L) 12/13/2023   CL 103 12/13/2023   CO2 24 12/13/2023   No results found for: ALT, AST,  GGT, ALKPHOS, BILITOT   RADIOGRAPHY: No results found.    IMPRESSION/PLAN: 1. 64 y.o. gentleman with a rising PSA of 1.1 s/p RALP 12/12/2023 for Stage pT3a, Gleason 4+5 prostate cancer.  Today I reviewed the findings and workup thus far.  We discussed the natural history of prostate cancer.  We reviewed the the implications of positive margins, extracapsular extension, and seminal vesicle involvement on the risk of prostate cancer recurrence. In his case, he has a rising postoperative psa. We reviewed some of the evidence suggesting an advantage for patients who undergo adjuvant radiotherapy in the setting in terms of disease control and overall survival. We discussed radiation treatment directed to the prostatic fossa with regard to the logistics and delivery of external beam radiation treatment.  At the conclusion of our conversation, the patient is interested in moving forward with 7.5 weeks of salvage***adjuvant external beam therapy. We will share our discussion with Dr. Cam. The patient appears to have a good understanding of his disease and our treatment recommendations which are of curative***salvage intent and is in agreement with the stated plan.  Therefore, we will move forward with treatment planning accordingly, in anticipation of beginning IMRT in the near future.   We spent {CHL ONC TIME VISIT - DTPQU:[id number]} face to face with the patient and more than 50% of that time was spent in counseling and/or coordination of care.    Sabra MICAEL Rusk, PA-C    Donnice Barge, MD  [facility]  Radiation Oncology Direct Dial: [phone]  Fax: [phone] [email or web address]  Skype  LinkedIn   This document serves as a record of services personally performed by Donnice Barge, MD and Sabra Rusk, PA-C. It was created on their behalf by Damien Blanks, a trained medical scribe. The creation of this record is based on the scribe's personal observations and the provider's  statements to them. This document has been checked and approved by the attending provider.   "

## 2024-05-08 NOTE — Progress Notes (Signed)
 " Radiation Oncology         [phone] ________________________________  Initial Outpatient Consultation  Name: Norman Ritter MRN: [id number]  Date: 05/08/2024  DOB: 03/14/1961  RR:Yzimprx, [name], MD  Cam Morene ORN, MD   REFERRING PHYSICIAN: Cam Morene ORN, MD  DIAGNOSIS: 64 y.o. 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    ICD-10-CM   1. Malignant neoplasm of prostate (HCC)  C61     2. Malignant neoplasm of prostate metastatic to bone (HCC)  C61    C79.51     3. 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 male with a diagnosis of oligometastatic prostate cancer. He was initially referred to Dr. Cam for an elevated PSA of 4. A repeat PSA obtained at consultation showed a further rise to 7.24. He underwent a prostate MRI on 09/26/23 showing a large PI-RADS 5 lesion in the right peripheral zone and suspected local pathologic adenopathy anterior to the left peripheral zone and left posterior margin of the bladder. An MRI fusion biopsy of the prostate was performed on 10/15/23 and revealed Gleason 4+4 prostate cancer (11/15 cores positive). A staging PSMA PET scan from 10/28/23 confirmed a hypermetabolic lymph node located anterior to the left lobe of the prostate and posterior to the bladder. He elected to proceed with RALP on 12/12/23 under the care of Dr. Cam. The periprostatic nodule was excised at the time of surgery as well. Final surgical pathology revealed Gleason 4+5 prostatic adenocarcinoma with extraprostatic extension it the right lobe from apex to base. Seminal vesicles, all margins, and all 12 sampled lymph nodes were negative. The periprostatic nodule was Gleason 4+4. His initial postoperative PSA was 1.1 on 01/31/2024 and  increased to 2.54 in November 2025 and got up to 4.16 on his most recent labs from 04/24/2024.  A repeat PSMA PET scan for disease restaging was performed on 05/05/24.  We personally reviewed the imaging which shows oligometastatic disease involving the thoracic spine, pelvic bones and pelvic lymph nodes but formal radiology report remains pending at the time of this visit.     The patient reviewed the pathology and PSA results with his urologist and he has kindly been referred today for discussion of potential salvage radiation treatment options.  He is accompanied by his wife, Norman Ritter, for today's visit.  PREVIOUS RADIATION THERAPY: No  PAST MEDICAL HISTORY:  Past Medical History:  Diagnosis Date   Family history of early CAD    Mother had early onset CAD and MI   Hyperlipemia       PAST SURGICAL HISTORY: Past Surgical History:  Procedure Laterality Date   COLONOSCOPY W/ POLYPECTOMY     x 4   DENTAL SURGERY     propofol  anes.  for abscess   KNEE ARTHROSCOPY W/ DEBRIDEMENT Left 02/2021   ROBOT ASSISTED LAPAROSCOPIC RADICAL PROSTATECTOMY N/A 12/12/2023   Procedure: PROSTATECTOMY, RADICAL, ROBOT-ASSISTED, LAPAROSCOPIC;  Surgeon: Cam Morene ORN, MD;  Location: [facility];  Service: Urology;  Laterality: N/A;  ROBOT-ASSISTED LAPAROSCOPIC RADICAL PROSTATECTOMY    FAMILY HISTORY: No family history on file.  SOCIAL HISTORY: daughter just finished her first semester of PA school at [employer] Social History   Socioeconomic History   Marital status: Married  Spouse name: Not on file   Number of children: Not on file   Years of education: Not on file   Highest education level: Not on file  Occupational History   Not on file  Tobacco Use   Smoking status: Former    Types: Cigarettes    Start date: 79    Quit date: 73    Years since quitting: 49.0   Smokeless tobacco: Never  Vaping Use   Vaping status: Never Used  Substance and Sexual Activity   Alcohol use: Yes    Comment:  occasional   Drug use: Never   Sexual activity: Not Currently  Other Topics Concern   Not on file  Social History Narrative   Not on file   Social Drivers of Health   Tobacco Use: Medium Risk (05/08/2024)   Patient History    Smoking Tobacco Use: Former    Smokeless Tobacco Use: Never    Passive Exposure: Not on file  Financial Resource Strain: Low Risk  (04/03/2024)   Received from [facility]   Overall Financial Resource Strain (CARDIA)    Difficulty of Paying Living Expenses: Not hard at all  Food Insecurity: No Food Insecurity (05/08/2024)   Epic    Worried About Radiation Protection Practitioner of Food in the Last Year: Never true    Ran Out of Food in the Last Year: Never true  Transportation Needs: No Transportation Needs (05/08/2024)   Epic    Lack of Transportation (Medical): No    Lack of Transportation (Non-Medical): No  Physical Activity: Not on file  Stress: Not on file  Social Connections: Not on file  Intimate Partner Violence: Not At Risk (05/08/2024)   Epic    Fear of Current or Ex-Partner: No    Emotionally Abused: No    Physically Abused: No    Sexually Abused: No  Depression (PHQ2-9): Low Risk (05/08/2024)   Depression (PHQ2-9)    PHQ-2 Score: 0  Alcohol Screen: Low Risk (05/08/2024)   Alcohol Screen    Last Alcohol Screening Score (AUDIT): 0  Housing: Low Risk (05/08/2024)   Epic    Unable to Pay for Housing in the Last Year: No    Number of Times Moved in the Last Year: 0    Homeless in the Last Year: No  Utilities: Not At Risk (05/08/2024)   Epic    Threatened with loss of utilities: No  Health Literacy: Not on file    ALLERGIES: Patient has no known allergies.  MEDICATIONS:  Current Outpatient Medications  Medication Sig Dispense Refill   Coenzyme Q10 100 MG capsule Take 100 mg by mouth daily.     simvastatin  (ZOCOR ) 40 MG tablet Take 40 mg by mouth at bedtime.     No current facility-administered medications for this encounter.    REVIEW OF  SYSTEMS:  On review of systems, the patient reports that he is doing well overall. He denies any chest pain, shortness of breath, cough, fevers, chills, night sweats, unintended weight changes. He denies any bowel disturbances, and denies abdominal pain, nausea or vomiting. He denies any new musculoskeletal or joint aches or pains. His IPSS was 8, indicating mild urinary symptoms although he remains quite bothered with significant postoperative incontinence despite working with physical therapy diligently. His SHIM was 2, indicating he has severe postoperative erectile dysfunction. A complete review of systems is obtained and is otherwise negative.    PHYSICAL EXAM:  Wt Readings from Last 3 Encounters:  05/08/24  212 lb 6.4 oz (96.3 kg)  12/12/23 211 lb (95.7 kg)  12/03/23 211 lb (95.7 kg)   Temp Readings from Last 3 Encounters:  05/08/24 97.9 F (36.6 C)  12/13/23 98.4 F (36.9 C) (Oral)  12/03/23 98.5 F (36.9 C) (Oral)   BP Readings from Last 3 Encounters:  05/08/24 (!) 133/95  12/13/23 (!) 141/87  12/03/23 127/87   Pulse Readings from Last 3 Encounters:  05/08/24 71  12/13/23 93  12/03/23 64   Pain Assessment Pain Score: 0-No pain/10  In general this is a well appearing Caucasian man in no acute distress. He's alert and oriented x4 and appropriate throughout the examination. Cardiopulmonary assessment is negative for acute distress, and he exhibits normal effort.     KPS = 100  100 - Normal; no complaints; no evidence of disease. 90   - Able to carry on normal activity; minor signs or symptoms of disease. 80   - Normal activity with effort; some signs or symptoms of disease. 34   - Cares for self; unable to carry on normal activity or to do active work. 60   - Requires occasional assistance, but is able to care for most of his personal needs. 50   - Requires considerable assistance and frequent medical care. 40   - Disabled; requires special care and assistance. 30   -  Severely disabled; hospital admission is indicated although death not imminent. 20   - Very sick; hospital admission necessary; active supportive treatment necessary. 10   - Moribund; fatal processes progressing rapidly. 0     - Dead  Karnofsky DA, Abelmann WH, Craver LS and Burchenal [facility] [phone]) The use of the nitrogen mustards in the palliative treatment of carcinoma: with particular reference to bronchogenic carcinoma Cancer 1 634-56  LABORATORY DATA:  Lab Results  Component Value Date   WBC 5.9 12/03/2023   HGB 12.9 (L) 12/13/2023   HCT 36.8 (L) 12/13/2023   MCV 92.1 12/03/2023   PLT 193 12/03/2023   Lab Results  Component Value Date   NA 137 12/13/2023   K 3.4 (L) 12/13/2023   CL 103 12/13/2023   CO2 24 12/13/2023   No results found for: ALT, AST, GGT, ALKPHOS, BILITOT   RADIOGRAPHY: No results found.    IMPRESSION/PLAN: 1. 64 y.o. 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 Today we reviewed the findings and workup thus far.  We discussed the natural history of prostate cancer.  We reviewed the the implications of positive margins, extracapsular extension, and seminal vesicle involvement on the risk of prostate cancer recurrence. In his case, extraprostatic extension was present and he has a rising, postoperative PSA with evidence of oligometastatic disease involving the thoracolumbar spine, pelvic bones and pelvic lymph nodes. We reviewed some of the evidence suggesting an advantage for patients who undergo salvage radiotherapy in this setting in terms of disease control and overall survival. We discussed the recommendation for radiation treatment directed to the prostatic fossa and PET positive pelvic metastases with regard to the logistics and delivery of external beam radiation treatment.  We would also recommend simultaneously treating the  oligo metastases in the thoracolumbar spine with 5 fractions of stereotactic body radiotherapy (SBRT); all concurrent with ADT.  We also discussed the recommendation for therapy escalation with ARPI and he will discuss this further with Dr. Cam at his upcoming follow-up visit.  He and his wife were encouraged to ask questions that were answered to their stated satisfaction.  He appears to have a good understanding of his disease and our treatment recommendations which are still of curative intent although, with the understanding that there is likely only a 25 to 30% chance for cure at this point but at minimum, this aggressive treatment would provide more durable control of his disease.  At the conclusion of our conversation, the patient is interested in moving forward with the recommended 7.5-week course of daily external beam radiotherapy to the prostatic fossa and PET positive pelvic metastases while simultaneously treating the oligo metastases in the thoracolumbar spine with 5 fractions of stereotactic body radiotherapy (SBRT); all concurrent with ADT +/- ARPI.  He has a scheduled follow-up visit to start the ADT on Monday, 05/11/2024 so we will share our discussion with Dr. Cam for consideration of therapy escalation at that time as well.  This will allow him additional time to continue working with physical therapy to regain as much continence as possible.  The patient appears to have a good understanding of his disease and our treatment recommendations and is in agreement with the stated plan.  Therefore, we will move forward with treatment planning accordingly, in anticipation of beginning IMRT in the near future once he is satisfied with his urinary continence.  He has our contact information and will let us  know when he is ready to proceed.  We enjoyed meeting him and his wife today and look forward to continuing to participate in his care.  We personally spent 70 minutes in this encounter  including chart review, reviewing radiological studies, meeting face-to-face with the patient, entering orders and completing documentation.    Sabra MICAEL Rusk, PA-C    Donnice Barge, MD  [facility]  Radiation Oncology Direct Dial: [phone]  Fax: [phone] [email or web address]  Skype  LinkedIn   This document serves as a record of services personally performed by Donnice Barge, MD and Sabra Rusk, PA-C. It was created on their behalf by Izetta Neither, a trained medical scribe. The creation of this record is based on the scribe's personal observations and the provider's statements to them. This document has been checked and approved by the attending provider.    "

## 2024-05-11 NOTE — Progress Notes (Signed)
 Introduced myself to the patient as the prostate nurse navigator. He is here to discuss his radiation treatment options. I provided patient with some written education and my direct contact information.  Patient knows to reach out with any questions or barriers that may arise.

## 2024-05-12 NOTE — Progress Notes (Signed)
 Patient received Eligard 22.5mg  on 1/5 at [facility].  Patient will also meet with Dr. Cam on 05/18/24.  Will continue to follow.

## 2024-05-19 NOTE — Progress Notes (Signed)
 Patient met with Dr. Cam on 1/12 and was prescribed Abiraterone, and received Eligard 22.5 on 05/12/24.   Patient will remain working with PT and prefers to wait a few months before radiation treatment.   RN will continue to follow.
